# Patient Record
Sex: Female | Born: 1980 | Race: White | Hispanic: No | Marital: Married | Smoking: Never smoker
Health system: Southern US, Community
[De-identification: ages and names within clinical notes are randomized; demographics above are authoritative.]

## PROBLEM LIST (undated history)

## (undated) DIAGNOSIS — F32A Depression, unspecified: Secondary | ICD-10-CM

## (undated) DIAGNOSIS — Q249 Congenital malformation of heart, unspecified: Secondary | ICD-10-CM

## (undated) DIAGNOSIS — N898 Other specified noninflammatory disorders of vagina: Principal | ICD-10-CM

## (undated) DIAGNOSIS — E039 Hypothyroidism, unspecified: Secondary | ICD-10-CM

## (undated) DIAGNOSIS — Z8619 Personal history of other infectious and parasitic diseases: Secondary | ICD-10-CM

## (undated) DIAGNOSIS — B009 Herpesviral infection, unspecified: Secondary | ICD-10-CM

## (undated) DIAGNOSIS — I8390 Asymptomatic varicose veins of unspecified lower extremity: Secondary | ICD-10-CM

## (undated) DIAGNOSIS — Z86718 Personal history of other venous thrombosis and embolism: Secondary | ICD-10-CM

## (undated) DIAGNOSIS — R519 Headache, unspecified: Secondary | ICD-10-CM

## (undated) HISTORY — DX: Hypothyroidism, unspecified: E03.9

## (undated) HISTORY — DX: Headache, unspecified: R51.9

## (undated) HISTORY — DX: Other specified noninflammatory disorders of vagina: N89.8

## (undated) HISTORY — DX: Personal history of other venous thrombosis and embolism: Z86.718

## (undated) HISTORY — DX: Herpesviral infection, unspecified: B00.9

## (undated) HISTORY — PX: ASD REPAIR: SHX258

## (undated) HISTORY — DX: Personal history of other infectious and parasitic diseases: Z86.19

## (undated) HISTORY — PX: OTHER SURGICAL HISTORY: SHX169

## (undated) HISTORY — DX: Asymptomatic varicose veins of unspecified lower extremity: I83.90

## (undated) HISTORY — DX: Depression, unspecified: F32.A

## (undated) HISTORY — PX: CARDIAC SURGERY: SHX584

---

## 2014-05-13 ENCOUNTER — Encounter (HOSPITAL_COMMUNITY): Payer: Self-pay | Admitting: Emergency Medicine

## 2014-05-13 ENCOUNTER — Emergency Department (HOSPITAL_COMMUNITY): Payer: BC Managed Care – PPO

## 2014-05-13 ENCOUNTER — Emergency Department (HOSPITAL_COMMUNITY)
Admission: EM | Admit: 2014-05-13 | Discharge: 2014-05-13 | Disposition: A | Discharge status: Home / Self Care | Payer: BC Managed Care – PPO | Attending: Emergency Medicine | Admitting: Emergency Medicine

## 2014-05-13 DIAGNOSIS — J40 Bronchitis, not specified as acute or chronic: Secondary | ICD-10-CM | POA: Diagnosis not present

## 2014-05-13 DIAGNOSIS — Y939 Activity, unspecified: Secondary | ICD-10-CM | POA: Diagnosis not present

## 2014-05-13 DIAGNOSIS — Z3202 Encounter for pregnancy test, result negative: Secondary | ICD-10-CM | POA: Insufficient documentation

## 2014-05-13 DIAGNOSIS — Z79899 Other long term (current) drug therapy: Secondary | ICD-10-CM | POA: Insufficient documentation

## 2014-05-13 DIAGNOSIS — R109 Unspecified abdominal pain: Secondary | ICD-10-CM | POA: Diagnosis present

## 2014-05-13 DIAGNOSIS — S39011A Strain of muscle, fascia and tendon of abdomen, initial encounter: Secondary | ICD-10-CM

## 2014-05-13 DIAGNOSIS — X58XXXA Exposure to other specified factors, initial encounter: Secondary | ICD-10-CM | POA: Diagnosis not present

## 2014-05-13 DIAGNOSIS — IMO0002 Reserved for concepts with insufficient information to code with codable children: Secondary | ICD-10-CM | POA: Diagnosis not present

## 2014-05-13 DIAGNOSIS — Y929 Unspecified place or not applicable: Secondary | ICD-10-CM | POA: Diagnosis not present

## 2014-05-13 LAB — URINALYSIS, ROUTINE W REFLEX MICROSCOPIC
Bilirubin Urine: NEGATIVE
GLUCOSE, UA: NEGATIVE mg/dL
Hgb urine dipstick: NEGATIVE
Ketones, ur: NEGATIVE mg/dL
Leukocytes, UA: NEGATIVE
Nitrite: NEGATIVE
Protein, ur: NEGATIVE mg/dL
Specific Gravity, Urine: 1.015 (ref 1.005–1.030)
UROBILINOGEN UA: 1 mg/dL (ref 0.0–1.0)
pH: 6.5 (ref 5.0–8.0)

## 2014-05-13 LAB — BASIC METABOLIC PANEL
Anion gap: 11 (ref 5–15)
BUN: 6 mg/dL (ref 6–23)
CHLORIDE: 103 meq/L (ref 96–112)
CO2: 28 mEq/L (ref 19–32)
Calcium: 8.6 mg/dL (ref 8.4–10.5)
Creatinine, Ser: 0.59 mg/dL (ref 0.50–1.10)
GFR calc non Af Amer: >90 mL/min (ref >90)
GLUCOSE: 114 mg/dL — AB (ref 70–99)
Potassium: 3.5 mEq/L — ABNORMAL LOW (ref 3.7–5.3)
Sodium: 142 mEq/L (ref 137–147)

## 2014-05-13 LAB — CBC WITH DIFFERENTIAL/PLATELET
Basophils Absolute: 0 10*3/uL (ref 0.0–0.1)
Basophils Relative: 0 % (ref 0–1)
Eosinophils Absolute: 0 10*3/uL (ref 0.0–0.7)
Eosinophils Relative: 0 % (ref 0–5)
HEMATOCRIT: 38.3 % (ref 36.0–46.0)
HEMOGLOBIN: 13.1 g/dL (ref 12.0–15.0)
LYMPHS ABS: 0.6 10*3/uL — AB (ref 0.7–4.0)
Lymphocytes Relative: 12 % (ref 12–46)
MCH: 31.4 pg (ref 26.0–34.0)
MCHC: 34.2 g/dL (ref 30.0–36.0)
MCV: 91.8 fL (ref 78.0–100.0)
MONOS PCT: 10 % (ref 3–12)
Monocytes Absolute: 0.6 10*3/uL (ref 0.1–1.0)
NEUTROS ABS: 4.2 10*3/uL (ref 1.7–7.7)
NEUTROS PCT: 78 % — AB (ref 43–77)
Platelets: 184 10*3/uL (ref 150–400)
RBC: 4.17 MIL/uL (ref 3.87–5.11)
RDW: 13 % (ref 11.5–15.5)
WBC: 5.4 10*3/uL (ref 4.0–10.5)

## 2014-05-13 LAB — PREGNANCY, URINE: Preg Test, Ur: NEGATIVE

## 2014-05-13 MED ORDER — ALBUTEROL SULFATE (2.5 MG/3ML) 0.083% IN NEBU
5.0000 mg | INHALATION_SOLUTION | Freq: Once | RESPIRATORY_TRACT | Status: AC
  Administered 2014-05-13: 5 mg via RESPIRATORY_TRACT
  Filled 2014-05-13: qty 6

## 2014-05-13 MED ORDER — IOHEXOL 300 MG/ML  SOLN
100.0000 mL | Freq: Once | INTRAMUSCULAR | Status: AC | PRN
  Administered 2014-05-13: 100 mL via INTRAVENOUS

## 2014-05-13 MED ORDER — METHYLPREDNISOLONE SODIUM SUCC 125 MG IJ SOLR
125.0000 mg | Freq: Once | INTRAMUSCULAR | Status: AC
  Administered 2014-05-13: 125 mg via INTRAVENOUS
  Filled 2014-05-13: qty 2

## 2014-05-13 MED ORDER — BENZONATATE 100 MG PO CAPS
200.0000 mg | ORAL_CAPSULE | Freq: Once | ORAL | Status: AC
  Administered 2014-05-13: 200 mg via ORAL
  Filled 2014-05-13: qty 2

## 2014-05-13 MED ORDER — IOHEXOL 300 MG/ML  SOLN
50.0000 mL | Freq: Once | INTRAMUSCULAR | Status: AC | PRN
  Administered 2014-05-13: 50 mL via ORAL

## 2014-05-13 MED ORDER — ALBUTEROL SULFATE HFA 108 (90 BASE) MCG/ACT IN AERS
2.0000 | INHALATION_SPRAY | RESPIRATORY_TRACT | Status: DC | PRN
  Administered 2014-05-13: 2 via RESPIRATORY_TRACT
  Filled 2014-05-13: qty 6.7

## 2014-05-13 MED ORDER — SODIUM CHLORIDE 0.9 % IV SOLN
Freq: Once | INTRAVENOUS | Status: AC
Start: 2014-05-13 — End: 2014-05-13
  Administered 2014-05-13: 18:00:00 via INTRAVENOUS

## 2014-05-13 MED ORDER — BENZONATATE 100 MG PO CAPS: 100.0000 mg | ORAL_CAPSULE | Freq: Three times a day (TID) | ORAL | Status: DC

## 2014-05-13 MED ORDER — PREDNISONE 20 MG PO TABS: 40.0000 mg | ORAL_TABLET | Freq: Every day | ORAL | Status: DC

## 2014-05-13 NOTE — ED Notes (Signed)
Pt advised we need urine sample

## 2014-05-13 NOTE — Discharge Instructions (Signed)
Muscle Strain A muscle strain is an injury that occurs when a muscle is stretched beyond its normal length. Usually a small number of muscle fibers are torn when this happens. Muscle strain is rated in degrees. First-degree strains have the least amount of muscle fiber tearing and pain. Second-degree and third-degree strains have increasingly more tearing and pain.  Usually, recovery from muscle strain takes 1-2 weeks. Complete healing takes 5-6 weeks.  CAUSES  Muscle strain happens when a sudden, violent force placed on a muscle stretches it too far. This may occur with lifting, sports, or a fall.  RISK FACTORS Muscle strain is especially common in athletes.  SIGNS AND SYMPTOMS At the site of the muscle strain, there may be:  Pain.  Bruising.  Swelling.  Difficulty using the muscle due to pain or lack of normal function. DIAGNOSIS  Your health care provider will perform a physical exam and ask about your medical history. TREATMENT  Often, the best treatment for a muscle strain is resting, icing, and applying cold compresses to the injured area.  HOME CARE INSTRUCTIONS   Use the PRICE method of treatment to promote muscle healing during the first 2-3 days after your injury. The PRICE method involves:  Protecting the muscle from being injured again.  Restricting your activity and resting the injured body part.  Icing your injury. To do this, put ice in a plastic bag. Place a towel between your skin and the bag. Then, apply the ice and leave it on from 15-20 minutes each hour. After the third day, switch to moist heat packs.  Apply compression to the injured area with a splint or elastic bandage. Be careful not to wrap it too tightly. This may interfere with blood circulation or increase swelling.  Elevate the injured body part above the level of your heart as often as you can.  Only take over-the-counter or prescription medicines for pain, discomfort, or fever as directed by your  health care provider.  Warming up prior to exercise helps to prevent future muscle strains. SEEK MEDICAL CARE IF:   You have increasing pain or swelling in the injured area.  You have numbness, tingling, or a significant loss of strength in the injured area. MAKE SURE YOU:   Understand these instructions.  Will watch your condition.  Will get help right away if you are not doing well or get worse. Document Released: 08/19/2005 Document Revised: 06/09/2013 Document Reviewed: 03/18/2013 [facility] Patient Information 2015 [place], [place]. This information is not intended to replace advice given to you by your health care provider. Make sure you discuss any questions you have with your health care provider. Acute Bronchitis Bronchitis is inflammation of the airways that extend from the windpipe into the lungs (bronchi). The inflammation often causes mucus to develop. This leads to a cough, which is the most common symptom of bronchitis.  In acute bronchitis, the condition usually develops suddenly and goes away over time, usually in a couple weeks. Smoking, allergies, and asthma can make bronchitis worse. Repeated episodes of bronchitis may cause further lung problems.  CAUSES Acute bronchitis is most often caused by the same virus that causes a cold. The virus can spread from person to person (contagious) through coughing, sneezing, and touching contaminated objects. SIGNS AND SYMPTOMS   Cough.   Fever.   Coughing up mucus.   Body aches.   Chest congestion.   Chills.   Shortness of breath.   Sore throat.  DIAGNOSIS  Acute bronchitis is usually  diagnosed through a physical exam. Your health care provider will also ask you questions about your medical history. Tests, such as chest X-rays, are sometimes done to rule out other conditions.  TREATMENT  Acute bronchitis usually goes away in a couple weeks. Oftentimes, no medical treatment is necessary. Medicines are sometimes  given for relief of fever or cough. Antibiotic medicines are usually not needed but may be prescribed in certain situations. In some cases, an inhaler may be recommended to help reduce shortness of breath and control the cough. A cool mist vaporizer may also be used to help thin bronchial secretions and make it easier to clear the chest.  HOME CARE INSTRUCTIONS  Get plenty of rest.   Drink enough fluids to keep your urine clear or pale yellow (unless you have a medical condition that requires fluid restriction). Increasing fluids may help thin your respiratory secretions (sputum) and reduce chest congestion, and it will prevent dehydration.   Take medicines only as directed by your health care provider.  If you were prescribed an antibiotic medicine, finish it all even if you start to feel better.  Avoid smoking and secondhand smoke. Exposure to cigarette smoke or irritating chemicals will make bronchitis worse. If you are a smoker, consider using nicotine gum or skin patches to help control withdrawal symptoms. Quitting smoking will help your lungs heal faster.   Reduce the chances of another bout of acute bronchitis by washing your hands frequently, avoiding people with cold symptoms, and trying not to touch your hands to your mouth, nose, or eyes.   Keep all follow-up visits as directed by your health care provider.  SEEK MEDICAL CARE IF: Your symptoms do not improve after 1 week of treatment.  SEEK IMMEDIATE MEDICAL CARE IF:  You develop an increased fever or chills.   You have chest pain.   You have severe shortness of breath.  You have bloody sputum.   You develop dehydration.  You faint or repeatedly feel like you are going to pass out.  You develop repeated vomiting.  You develop a severe headache. MAKE SURE YOU:   Understand these instructions.  Will watch your condition.  Will get help right away if you are not doing well or get worse. Document Released:  09/26/2004 Document Revised: 01/03/2014 Document Reviewed: 02/09/2013 [facility] Patient Information 2015 [place], [place]. This information is not intended to replace advice given to you by your health care provider. Make sure you discuss any questions you have with your health care provider.

## 2014-05-13 NOTE — ED Provider Notes (Signed)
CSN: [id number]     Arrival date & time 05/13/14  1716 History   First MD Initiated Contact with Patient 05/13/14 1735     Chief Complaint  Patient presents with  . Abdominal Pain     (Consider location/radiation/quality/duration/timing/severity/associated sxs/prior Treatment) HPI Comments: Patient presents to ER for evaluation of cough and abdominal pain. Patient reports that she developed cough earlier in the week. She has had persistent and worsening cough for 5 days. Patient was seen earlier in the week and diagnosed with bilateral ear infections, started on amoxicillin. Cough worsened through the course of the week, was seen again today and antibiotic was switched to Bactrim. She was also prescribed a hydrocortisone-based cough syrup which she has not started yet. She reports that she has had persistent cough for the course of today. She is lying on her bed, had an episode of coughing and felt sudden pain in her lower abdomen. Patient has had sharp pain in the area ever since. No nausea, vomiting, diarrhea.  Patient is a 33 y.o. female presenting with abdominal pain.  Abdominal Pain Associated symptoms: cough     History reviewed. No pertinent past medical history. History reviewed. No pertinent past surgical history. History reviewed. No pertinent family history. History  Substance Use Topics  . Smoking status: Never Smoker   . Smokeless tobacco: Not on file  . Alcohol Use: No   OB History   Grav Para Term Preterm Abortions TAB SAB Ect Mult Living                 Review of Systems  Respiratory: Positive for cough.   Gastrointestinal: Positive for abdominal pain.  All other systems reviewed and are negative.     Allergies  Review of patient's allergies indicates no known allergies.  Home Medications   Prior to Admission medications   Medication Sig Start Date End Date Taking? Authorizing Provider  valACYclovir (VALTREX) 1000 MG tablet Take 1,000 mg by mouth daily.   02/04/14  Yes Historical Provider, MD  HYDROcodone-homatropine (HYCODAN) 5-1.5 MG/5ML syrup Take 5 mLs by mouth every 4 (four) hours as needed for cough.  05/13/14   Historical Provider, MD  sulfamethoxazole-trimethoprim (BACTRIM DS) 800-160 MG per tablet Take 1 tablet by mouth 2 (two) times daily.  05/13/14   Historical Provider, MD   BP 128/72  Pulse 75  Temp(Src) 100.3 F (37.9 C) (Oral)  Resp 19  Ht  (1.626 m)  Wt 160 lb (72.576 kg)  BMI 27.45 kg/m2  SpO2 100%  LMP 05/04/2014 Physical Exam  Constitutional: She is oriented to person, place, and time. She appears well-developed and well-nourished. No distress.  HENT:  Head: Normocephalic and atraumatic.  Right Ear: Hearing normal.  Left Ear: Hearing normal.  Nose: Nose normal.  Mouth/Throat: Oropharynx is clear and moist and mucous membranes are normal.  Eyes: Conjunctivae and EOM are normal. Pupils are equal, round, and reactive to light.  Neck: Normal range of motion. Neck supple.  Cardiovascular: Regular rhythm, S1 normal and S2 normal.  Exam reveals no gallop and no friction rub.   No murmur heard. Pulmonary/Chest: Effort normal and breath sounds normal. No respiratory distress. She exhibits no tenderness.  Abdominal: Soft. Normal appearance and bowel sounds are normal. There is no hepatosplenomegaly. There is generalized tenderness. There is no rebound, no guarding, no tenderness at McBurney's point and negative Murphy's sign. No hernia.  Musculoskeletal: Normal range of motion.  Neurological: She is alert and oriented to person, place, and  time. She has normal strength. No cranial nerve deficit or sensory deficit. Coordination normal. GCS eye subscore is 4. GCS verbal subscore is 5. GCS motor subscore is 6.  Skin: Skin is warm, dry and intact. No rash noted. No cyanosis.  Psychiatric: She has a normal mood and affect. Her speech is normal and behavior is normal. Thought content normal.    ED Course  Procedures (including  critical care time) Labs Review Labs Reviewed  CBC WITH DIFFERENTIAL - Abnormal; Notable for the following:    Neutrophils Relative % 78 (*)    Lymphs Abs 0.6 (*)    All other components within normal limits  BASIC METABOLIC PANEL - Abnormal; Notable for the following:    Potassium 3.5 (*)    Glucose, Bld 114 (*)    All other components within normal limits  URINALYSIS, ROUTINE W REFLEX MICROSCOPIC  PREGNANCY, URINE    Imaging Review Dg Chest 2 View  05/13/2014   CLINICAL DATA:  Cough.  Fever.  EXAM: CHEST  2 VIEW  COMPARISON:  No priors.  FINDINGS: Lung volumes are normal. No consolidative airspace disease. No pleural effusions. No pneumothorax. No pulmonary nodule or mass noted. Pulmonary vasculature and the cardiomediastinal silhouette are within normal limits. Status post median sternotomy.  IMPRESSION: No radiographic evidence of acute cardiopulmonary disease.   Electronically Signed   By: Trudie Reed M.D.   On: 05/13/2014 19:26   Ct Abdomen Pelvis W Contrast  05/13/2014   CLINICAL DATA:  Lower abdominal pain.  EXAM: CT ABDOMEN AND PELVIS WITH CONTRAST  TECHNIQUE: Multidetector CT imaging of the abdomen and pelvis was performed using the standard protocol following bolus administration of intravenous contrast.  CONTRAST:  50mL OMNIPAQUE IOHEXOL 300 MG/ML SOLN, OMNIPAQUE IOHEXOL 300 MG/ML SOLN  COMPARISON:  None.  FINDINGS: The lung bases demonstrate streaky atelectasis but no infiltrates or effusions. The heart is normal in size. No pericardial effusion.  The liver is unremarkable. No focal lesions or biliary dilatation. The portal and hepatic veins are patent. The gallbladder is mildly contracted. No common bile duct dilatation. The pancreas is normal. The spleen is upper limits of normal in size. No focal lesions. The adrenal glands and kidneys are normal.  The stomach, duodenum, small bowel and colon are unremarkable. No inflammatory changes, mass lesions or obstructive  findings. The appendix is normal. No mesenteric or retroperitoneal mass or adenopathy. The aorta and branch vessels are patent. The major venous structures are patent. 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  The uterus and ovaries are unremarkable. Normal endometrial thickness. There is a small amount of fluid in the endocervical canal. The left ovary demonstrates a simple appearing cyst. The bladder is normal. No pelvic mass or adenopathy. No free pelvic fluid collections. No inguinal mass or adenopathy.  The bony structures are unremarkable.  IMPRESSION: No acute abdominal/pelvic findings, mass lesions or adenopathy.   Electronically Signed   By: Loralie Champagne M.D.   On: 05/13/2014 21:23     EKG Interpretation None      MDM   Final diagnoses:  None   bronchitis  Abdominal wall strain  Patient presents to the ER for evaluation of cough and abdominal pain. Patient has had a cough for nearly a week. She has been on 2 different antibiotics. Cough worsened today. She had sudden onset of pain across her mid and lower abdomen with a cough earlier. The pain is pretty constant since. It hurts when she moves. She has diffuse  tenderness across the lower abdomen, but there is no guarding or rebound.  Chest x-ray does not show pneumonia. Lab work was unremarkable. She did have a low-grade fever. This is likely secondary to upper respiratory infection. With fever and abdominal pain, a CT scan was considered important to rule out appendicitis, diverticulitis, et Karie Soda. CT scan was performed and was negative. Patient will be discharged. She can continue the current antibiotics, will add prednisone and albuterol. She was administered Solu-Medrol and a nebulizer here in the ER with improvement of her breathing and decreased cough. She has already been prescribed Hycodan, she can use this as needed at home. She was counseled that she should not breast-feed after taking the hydrocodone.    Gilda Crease, MD 05/13/14 2135

## 2014-05-13 NOTE — ED Notes (Signed)
RT in room for MDI instruction for discharge. Prescriptions and follow up care discussed. No concerns voiced.

## 2014-05-13 NOTE — ED Notes (Signed)
Pt states she has been coughing past few days and today while coughing she felt sudden pain across her stomach. The pt states the pain is generalized across the middle of abdomen.

## 2014-05-15 ENCOUNTER — Encounter (HOSPITAL_COMMUNITY): Payer: Self-pay | Admitting: Emergency Medicine

## 2014-05-15 ENCOUNTER — Emergency Department (HOSPITAL_COMMUNITY)
Admission: EM | Admit: 2014-05-15 | Discharge: 2014-05-15 | Disposition: A | Discharge status: Home / Self Care | Payer: BC Managed Care – PPO | Source: Home / Self Care | Attending: Emergency Medicine | Admitting: Emergency Medicine

## 2014-05-15 ENCOUNTER — Telehealth (HOSPITAL_COMMUNITY): Payer: Self-pay

## 2014-05-15 DIAGNOSIS — J019 Acute sinusitis, unspecified: Secondary | ICD-10-CM

## 2014-05-15 HISTORY — DX: Congenital malformation of heart, unspecified: Q24.9

## 2014-05-15 LAB — POCT RAPID STREP A: STREPTOCOCCUS, GROUP A SCREEN (DIRECT): NEGATIVE

## 2014-05-15 MED ORDER — IPRATROPIUM BROMIDE 0.02 % IN SOLN
RESPIRATORY_TRACT | Status: AC
  Filled 2014-05-15: qty 2.5

## 2014-05-15 MED ORDER — ALBUTEROL SULFATE (2.5 MG/3ML) 0.083% IN NEBU
INHALATION_SOLUTION | RESPIRATORY_TRACT | Status: AC
  Filled 2014-05-15: qty 6

## 2014-05-15 MED ORDER — AMOXICILLIN-POT CLAVULANATE ER 1000-62.5 MG PO TB12: 2.0000 | ORAL_TABLET | Freq: Two times a day (BID) | ORAL | Status: DC

## 2014-05-15 MED ORDER — IPRATROPIUM BROMIDE 0.02 % IN SOLN
0.5000 mg | Freq: Once | RESPIRATORY_TRACT | Status: AC
  Administered 2014-05-15: 0.5 mg via RESPIRATORY_TRACT

## 2014-05-15 MED ORDER — CEFTRIAXONE SODIUM 1 G IJ SOLR
1.0000 g | Freq: Once | INTRAMUSCULAR | Status: AC
  Administered 2014-05-15: 1 g via INTRAMUSCULAR

## 2014-05-15 MED ORDER — LIDOCAINE HCL (PF) 1 % IJ SOLN
INTRAMUSCULAR | Status: AC
  Filled 2014-05-15: qty 5

## 2014-05-15 MED ORDER — SODIUM CHLORIDE 0.9 % IN NEBU
INHALATION_SOLUTION | RESPIRATORY_TRACT | Status: AC
  Filled 2014-05-15: qty 3

## 2014-05-15 MED ORDER — CEFTRIAXONE SODIUM 1 G IJ SOLR
INTRAMUSCULAR | Status: AC
  Filled 2014-05-15: qty 10

## 2014-05-15 MED ORDER — ALBUTEROL SULFATE (2.5 MG/3ML) 0.083% IN NEBU
5.0000 mg | INHALATION_SOLUTION | Freq: Once | RESPIRATORY_TRACT | Status: AC
  Administered 2014-05-15: 5 mg via RESPIRATORY_TRACT

## 2014-05-15 MED ORDER — SODIUM CHLORIDE 0.9 % IJ SOLN
INTRAMUSCULAR | Status: AC
  Filled 2014-05-15: qty 3

## 2014-05-15 NOTE — ED Notes (Signed)
S/S allergic reaction reviewed w/ pt. 

## 2014-05-15 NOTE — ED Provider Notes (Signed)
CSN: [id number]     Arrival date & time 05/15/14  0902 History   First MD Initiated Contact with Patient 05/15/14 0919     Chief Complaint  Patient presents with  . Fever  . Sore Throat   (Consider location/radiation/quality/duration/timing/severity/associated sxs/prior Treatment) HPI 33 year old female presents complaining of being sick for almost 2 weeks. This started approximately 11 days ago with runny nose and sore throat. She also had bilateral ear pain. After 6 days she went to an urgent care and was diagnosed with bilateral acute otitis media. and was treated with amoxicillin. She got no relief from the amoxicillin. Friday morning 2 days ago she went to a different urgent care, she did not receive any diagnosis but she was treated with Bactrim and prednisone, and was told to stop the amoxicillin. Friday night she went to [facility] emergency department and was diagnosed with bronchitis, told to continue her medications as prescribed and she was also given an albuterol inhaler. She feels like nothing has helped. She has severe sore throat, frontal headache, rhinorrhea, intermittent fevers up to 102F, cough, wheezing. She feels like her symptoms have all been progressively worsening. She did not start the prednisone because she does not like the way it makes her feel. Denies chest pain or shortness of breath. She is currently breast-feeding  Past Medical History  Diagnosis Date  . Congenital heart defect    Past Surgical History  Procedure Laterality Date  . Cardiac surgery      as infant and child   No family history on file. History  Substance Use Topics  . Smoking status: Never Smoker   . Smokeless tobacco: Not on file  . Alcohol Use: No   OB History   Grav Para Term Preterm Abortions TAB SAB Ect Mult Living                 Review of Systems  Constitutional: Positive for fever, chills and fatigue.  HENT: Positive for congestion, ear pain, postnasal drip, rhinorrhea, sinus  pressure and sore throat. Negative for ear discharge.   Respiratory: Positive for cough, chest tightness and wheezing. Negative for shortness of breath.   Cardiovascular: Negative for chest pain and leg swelling.  Gastrointestinal: Positive for abdominal pain (had abdominal pain on Friday, that has resolved). Negative for nausea, vomiting and diarrhea.  Musculoskeletal: Positive for myalgias. Negative for arthralgias.  All other systems reviewed and are negative.   Allergies  Review of patient's allergies indicates no known allergies.  Home Medications   Prior to Admission medications   Medication Sig Start Date End Date Taking? Authorizing Provider  sulfamethoxazole-trimethoprim (BACTRIM DS) 800-160 MG per tablet Take 1 tablet by mouth 2 (two) times daily.  05/13/14  Yes Historical Provider, MD  amoxicillin-clavulanate (AUGMENTIN XR) 1000-62.5 MG per tablet Take 2 tablets by mouth 2 (two) times daily. 05/15/14   Adrian Blackwater Darilyn Storbeck, PA-C  benzonatate (TESSALON) 100 MG capsule Take 1 capsule (100 mg total) by mouth every 8 (eight) hours. 05/13/14   Gilda Crease, MD  HYDROcodone-homatropine [facility]) 5-1.5 MG/5ML syrup Take 5 mLs by mouth every 4 (four) hours as needed for cough.  05/13/14   Historical Provider, MD  predniSONE (DELTASONE) 20 MG tablet Take 2 tablets (40 mg total) by mouth daily with breakfast. 05/13/14   Gilda Crease, MD  valACYclovir (VALTREX) 1000 MG tablet Take 1,000 mg by mouth daily.  02/04/14   Historical Provider, MD   BP 111/66  Pulse 104  Temp(Src)  100.9 F (38.3 C) (Oral)  Resp 20  SpO2 99%  LMP 05/04/2014 Physical Exam  Nursing note and vitals reviewed. Constitutional: She is oriented to person, place, and time. Vital signs are normal. She appears well-developed and well-nourished. She appears ill. No distress.  HENT:  Head: Normocephalic and atraumatic.  Right Ear: External ear and ear canal normal. Tympanic membrane is injected. Tympanic  membrane is not erythematous, not retracted and not bulging.  Left Ear: External ear and ear canal normal. Tympanic membrane is injected. Tympanic membrane is not erythematous, not retracted and not bulging.  Nose: Right sinus exhibits maxillary sinus tenderness and frontal sinus tenderness. Left sinus exhibits maxillary sinus tenderness and frontal sinus tenderness.  Mouth/Throat: Uvula is midline and mucous membranes are normal. Posterior oropharyngeal erythema (With purulent drainage across the tonsils and posterior oropharynx) present. No oropharyngeal exudate or tonsillar abscesses.  Neck: Normal range of motion. Neck supple.  Cardiovascular: Normal rate, regular rhythm and normal heart sounds.   Pulmonary/Chest: Effort normal. Not tachypneic. No respiratory distress. She has no decreased breath sounds. She has wheezes (diffuse, inspiratory and expiratory). She has no rhonchi. She has no rales.  Lymphadenopathy:       Head (right side): Submandibular and tonsillar adenopathy present.       Head (left side): Submandibular and tonsillar adenopathy present.    She has no cervical adenopathy.       Right: No supraclavicular adenopathy present.       Left: No supraclavicular adenopathy present.  Neurological: She is alert and oriented to person, place, and time. She has normal strength. Coordination normal.  Skin: Skin is warm and dry. No rash noted. She is not diaphoretic.  Psychiatric: She has a normal mood and affect. Judgment normal.    ED Course  Procedures (including critical care time) Labs Review Labs Reviewed  POCT RAPID STREP [facility] URG CARE ONLY)    Imaging Review Dg Chest 2 View  05/13/2014   CLINICAL DATA:  Cough.  Fever.  EXAM: CHEST  2 VIEW  COMPARISON:  No priors.  FINDINGS: Lung volumes are normal. No consolidative airspace disease. No pleural effusions. No pneumothorax. No pulmonary nodule or mass noted. Pulmonary vasculature and the cardiomediastinal silhouette are within  normal limits. Status post median sternotomy.  IMPRESSION: No radiographic evidence of acute cardiopulmonary disease.   Electronically Signed   By: Trudie Reed M.D.   On: 05/13/2014 19:26   Ct Abdomen Pelvis W Contrast  05/13/2014   CLINICAL DATA:  Lower abdominal pain.  EXAM: CT ABDOMEN AND PELVIS WITH CONTRAST  TECHNIQUE: Multidetector CT imaging of the abdomen and pelvis was performed using the standard protocol following bolus administration of intravenous contrast.  CONTRAST:  50mL OMNIPAQUE IOHEXOL 300 MG/ML SOLN, OMNIPAQUE IOHEXOL 300 MG/ML SOLN  COMPARISON:  None.  FINDINGS: The lung bases demonstrate streaky atelectasis but no infiltrates or effusions. The heart is normal in size. No pericardial effusion.  The liver is unremarkable. No focal lesions or biliary dilatation. The portal and hepatic veins are patent. The gallbladder is mildly contracted. No common bile duct dilatation. The pancreas is normal. The spleen is upper limits of normal in size. No focal lesions. The adrenal glands and kidneys are normal.  The stomach, duodenum, small bowel and colon are unremarkable. No inflammatory changes, mass lesions or obstructive findings. The appendix is normal. No mesenteric or retroperitoneal mass or adenopathy. The aorta and branch vessels are patent. The major venous structures are patent. There is  a retroaortic left renal vein.  The uterus and ovaries are unremarkable. Normal endometrial thickness. There is a small amount of fluid in the endocervical canal. The left ovary demonstrates a simple appearing cyst. The bladder is normal. No pelvic mass or adenopathy. No free pelvic fluid collections. No inguinal mass or adenopathy.  The bony structures are unremarkable.  IMPRESSION: No acute abdominal/pelvic findings, mass lesions or adenopathy.   Electronically Signed   By: Loralie Champagne M.D.   On: 05/13/2014 21:23     MDM   1. Acute sinusitis, unspecified    The most likely diagnosis at  this point he is bacterial sinusitis, that could explain all of her symptoms. She has treatment failure with amoxicillin and Bactrim, will treat with an injection of ceftriaxone here and discharge with high-dose Augmentin. She will start the prednisone, I will use her inhaler as needed for wheezing. She had improvement in the wheezing with a breathing treatment today. followup if no improvement in 4 days.   Meds ordered this encounter  Medications  . albuterol (PROVENTIL) (2.5 MG/3ML) 0.083% nebulizer solution 5 mg    Sig:   . ipratropium (ATROVENT) nebulizer solution 0.5 mg    Sig:   . cefTRIAXone (ROCEPHIN) injection 1 g    Sig:   . amoxicillin-clavulanate (AUGMENTIN XR) 1000-62.5 MG per tablet    Sig: Take 2 tablets by mouth 2 (two) times daily.    Dispense:  40 tablet    Refill:  0    Order Specific Question:  Supervising Provider    Answer:  Lorenz Coaster, DAVID C [[id number]]       Graylon Good, PA-C 05/15/14 1034

## 2014-05-15 NOTE — Discharge Instructions (Signed)

## 2014-05-15 NOTE — ED Provider Notes (Signed)
Medical screening examination/treatment/procedure(s) were performed by non-physician practitioner and as supervising physician I was immediately available for consultation/collaboration.  Leslee Home, M.D.  Reuben Likes, MD 05/15/14 1100

## 2014-05-15 NOTE — ED Notes (Signed)
Chart reviewed by Almedia Balls PA.  Pt need to have the antibiotic prescribed.  Several pharmacies in [place] and they did not have medication.  The closest pharmacy to patient that had medication is the [facility].  Rx was e-scribed to that pharmacy.  Patient called, made aware and states she will pick RX up today

## 2014-05-15 NOTE — ED Notes (Signed)
Pt instructed to take her first dose of her previous Prednisone Rx.  Pt did so.

## 2014-05-15 NOTE — ED Notes (Signed)
Started with runny nose & sore throat 11 days ago.  Went to an urgent care 6 days ago - was given amoxicillin for BOM.  No improvement with abx.  Went to another urgent care 2 days ago - wasn't told dx, but was given Septra DS.  That night, went to ED - was told to stop taking Septra DS; has been taking albuterol HFA.  Reports severe sore throat and feeling of swollen throat, bilat earache returning, HA.  Started with intermittent fevers 3 days ago.  C/O productive cough with wheezing.

## 2014-05-17 LAB — CULTURE, GROUP A STREP

## 2014-06-27 ENCOUNTER — Ambulatory Visit (INDEPENDENT_AMBULATORY_CARE_PROVIDER_SITE_OTHER): Payer: BC Managed Care – PPO | Admitting: Adult Health

## 2014-06-27 ENCOUNTER — Encounter: Payer: Self-pay | Admitting: Adult Health

## 2014-06-27 VITALS — BP 118/60 | Ht 64.0 in | Wt 173.5 lb

## 2014-06-27 DIAGNOSIS — Z8619 Personal history of other infectious and parasitic diseases: Secondary | ICD-10-CM | POA: Insufficient documentation

## 2014-06-27 DIAGNOSIS — L298 Other pruritus: Secondary | ICD-10-CM

## 2014-06-27 DIAGNOSIS — N898 Other specified noninflammatory disorders of vagina: Secondary | ICD-10-CM

## 2014-06-27 HISTORY — DX: Personal history of other infectious and parasitic diseases: Z86.19

## 2014-06-27 HISTORY — DX: Other specified noninflammatory disorders of vagina: N89.8

## 2014-06-27 LAB — POCT WET PREP (WET MOUNT): WBC, Wet Prep HPF POC: NEGATIVE

## 2014-06-27 MED ORDER — VALACYCLOVIR HCL 1 G PO TABS: 1000.0000 mg | ORAL_TABLET | Freq: Every day | ORAL | Status: DC

## 2014-06-27 NOTE — Progress Notes (Signed)
Subjective:     Patient ID: Joan Mcgee, female   DOB: 06/10/1981, 33 y.o.   MRN: [id number]  HPI Joan Mcgee is a 33 year old white female,new to this practice, in complaining of vaginal itch then got period and it was better.Has had yeast and BV in past.Used diflucan and boric acid supp.  Review of Systems See HPI Reviewed past medical,surgical, social and family history. Reviewed medications and allergies.     Objective:   Physical Exam BP 118/60  Ht 5\' 4"  (1.626 m)  Wt 173 lb 8 oz (78.699 kg)  BMI 29.77 kg/m2  LMP 06/22/2014   Skin warm and dry.Pelvic: external genitalia is normal in appearance, vagina: scant discharge without odor, cervix:smooth and bulbous, uterus: normal size, shape and contour, non tender, no masses felt, adnexa: no masses or tenderness noted. Wet prep:negative,has history of herpes and wants refill on valtrex.  Assessment:     Vaginal itch   History of herpes  Plan:    Refilled valtrex 1 gm #90 1 daily with 4 refills Try rephresh Will call when for pap appt in near future

## 2014-06-27 NOTE — Patient Instructions (Signed)
Try REPHRESH to stabilize PH Follow up for pap in near future

## 2014-07-05 ENCOUNTER — Encounter: Payer: Self-pay | Admitting: Adult Health

## 2014-07-19 ENCOUNTER — Other Ambulatory Visit: Payer: BC Managed Care – PPO | Admitting: Adult Health

## 2014-07-26 ENCOUNTER — Other Ambulatory Visit: Payer: BC Managed Care – PPO | Admitting: Adult Health

## 2014-08-19 ENCOUNTER — Telehealth: Payer: Self-pay | Admitting: *Deleted

## 2014-08-19 ENCOUNTER — Other Ambulatory Visit: Payer: BC Managed Care – PPO | Admitting: Adult Health

## 2014-08-19 ENCOUNTER — Ambulatory Visit (INDEPENDENT_AMBULATORY_CARE_PROVIDER_SITE_OTHER): Payer: BC Managed Care – PPO | Admitting: Adult Health

## 2014-08-19 ENCOUNTER — Encounter: Payer: Self-pay | Admitting: Adult Health

## 2014-08-19 VITALS — BP 128/72 | Temp 101.4°F | Ht 64.0 in | Wt 181.0 lb

## 2014-08-19 DIAGNOSIS — N61 Inflammatory disorders of breast: Secondary | ICD-10-CM

## 2014-08-19 MED ORDER — AMOXICILLIN-POT CLAVULANATE 875-125 MG PO TABS: 1.0000 | ORAL_TABLET | Freq: Two times a day (BID) | ORAL | Status: DC

## 2014-08-19 MED ORDER — IBUPROFEN 800 MG PO TABS: 800.0000 mg | ORAL_TABLET | Freq: Three times a day (TID) | ORAL | Status: DC | PRN

## 2014-08-19 NOTE — Patient Instructions (Signed)
Breastfeeding and Mastitis Mastitis is inflammation of the breast tissue. It can occur in women who are breastfeeding. This can make breastfeeding painful. Mastitis will sometimes go away on its own. Your health care provider will help determine if treatment is needed. CAUSES Mastitis is often associated with a blocked milk (lactiferous) duct. This can happen when too much milk builds up in the breast. Causes of excess milk in the breast can include:  Poor latch-on. If your baby is not latched onto the breast properly, she or he may not empty your breast completely while breastfeeding.  Allowing too much time to pass between feedings.  Wearing a bra or other clothing that is too tight. This puts extra pressure on the lactiferous ducts so milk does not flow through them as it should. Mastitis can also be caused by a bacterial infection. Bacteria may enter the breast tissue through cuts or openings in the skin. In women who are breastfeeding, this may occur because of cracked or irritated skin. Cracks in the skin are often caused when your baby does not latch on properly to the breast. SIGNS AND SYMPTOMS  Swelling, redness, tenderness, and pain in an area of the breast.  Swelling of the glands under the arm on the same side.  Fever may or may not accompany mastitis. If an infection is allowed to progress, a collection of pus (abscess) may develop. DIAGNOSIS  Your health care provider can usually diagnose mastitis based on your symptoms and a physical exam. Tests may be done to help confirm the diagnosis. These may include:  Removal of pus from the breast by applying pressure to the area. This pus can be examined in the lab to determine which bacteria are present. If an abscess has developed, the fluid in the abscess can be removed with a needle. This can also be used to confirm the diagnosis and determine the bacteria present. In most cases, pus will not be present.  Blood tests to determine if  your body is fighting a bacterial infection.  Mammogram or ultrasound tests to rule out other problems or diseases. TREATMENT  Mastitis that occurs with breastfeeding will sometimes go away on its own. Your health care provider may choose to wait 24 hours after first seeing you to decide whether a prescription medicine is needed. If your symptoms are worse after 24 hours, your health care provider will likely prescribe an antibiotic medicine to treat the mastitis. He or she will determine which bacteria are most likely causing the infection and will then select an appropriate antibiotic medicine. This is sometimes changed based on the results of tests performed to identify the bacteria, or if there is no response to the antibiotic medicine selected. Antibiotic medicines are usually given by mouth. You may also be given medicine for pain. HOME CARE INSTRUCTIONS  Only take over-the-counter or prescription medicines for pain, fever, or discomfort as directed by your health care provider.  If your health care provider prescribed an antibiotic medicine, take the medicine as directed. Make sure you finish it even if you start to feel better.  Do not wear a tight or underwire bra. Wear a soft, supportive bra.  Increase your fluid intake, especially if you have a fever.  Continue to empty the breast. Your health care provider can tell you whether this milk is safe for your infant or needs to be thrown out. You may be told to stop nursing until your health care provider thinks it is safe for your baby.   Use a breast pump if you are advised to stop nursing.  Keep your nipples clean and dry.  Empty the first breast completely before going to the other breast. If your baby is not emptying your breasts completely for some reason, use a breast pump to empty your breasts.  If you go back to work, pump your breasts while at work to stay in time with your nursing schedule.  Avoid allowing your breasts to become  overly filled with milk (engorged). SEEK MEDICAL CARE IF:  You have pus-like discharge from the breast.  Your symptoms do not improve with the treatment prescribed by your health care provider within 2 days. SEEK IMMEDIATE MEDICAL CARE IF:  Your pain and swelling are getting worse.  You have pain that is not controlled with medicine.  You have a red line extending from the breast toward your armpit.  You have a fever or persistent symptoms for more than 2-3 days.  You have a fever and your symptoms suddenly get worse. MAKE SURE YOU:   Understand these instructions.  Will watch your condition.  Will get help right away if you are not doing well or get worse. Document Released: 12/14/2004 Document Revised: 08/24/2013 Document Reviewed: 03/25/2013 [facility] Patient Information 2015 [place], [place]. This information is not intended to replace advice given to you by your health care provider. Make sure you discuss any questions you have with your health care provider. Take Augmentin and rest and take motrin or tylenol

## 2014-08-19 NOTE — Telephone Encounter (Signed)
Told her what have to see, so she is coming in now to be seen

## 2014-08-19 NOTE — Telephone Encounter (Signed)
Pt running a fever 101, right breast sore to touch. Pt states she is some better today. Pt requesting an ABX.

## 2014-08-19 NOTE — Progress Notes (Signed)
Subjective:     Patient ID: Joan Mcgee, female   DOB: 11/18/1980, 33 y.o.   MRN: [id number]  HPI Joan Mcgee is a 33 year old white female in complaining of pain in right breast and fever, feels bad.Her daughter is 222.34 years old and still breast feeds at night.Throat scratchy too.  Review of Systems See HPI Reviewed past medical,surgical, social and family history. Reviewed medications and allergies.     Objective:   Physical Exam BP 128/72 mmHg  Temp(Src) 101.4 F (38.6 C)  Ht 5\' 4"  (1.626 m)  Wt 181 lb (82.101 kg)  BMI 31.05 kg/m2  LMP 08/05/2014    Skin warm and dry,  Breasts:no dominate palpable mass, retraction or nipple discharge on left, on right has thickness and pain at 10-12 0'clock, no retraction or nipple discharge, has bilateral swollen lymph nodes in neck and throat red no pustules, and lungs are clear.  Assessment:     Mastitis right breast    Plan:     Rx Augmentin 875 mg 1 bid x 10 days #20 no refills Rx Motrin 800 mg #60 1 every 8 hours prn with 1 refill Use cabbage leaves, or ice prn Return as scheduled 12/29 for physical.   Rest, use warm salt water gargles and tylenol as needed If not better call Monday, eat yogurt but if has itching use monistat

## 2014-08-30 ENCOUNTER — Other Ambulatory Visit (HOSPITAL_COMMUNITY)
Admission: RE | Admit: 2014-08-30 | Discharge: 2014-08-30 | Disposition: A | Discharge status: Home / Self Care | Payer: BLUE CROSS/BLUE SHIELD | Source: Ambulatory Visit | Attending: Adult Health | Admitting: Adult Health

## 2014-08-30 ENCOUNTER — Encounter: Payer: Self-pay | Admitting: Adult Health

## 2014-08-30 ENCOUNTER — Ambulatory Visit (INDEPENDENT_AMBULATORY_CARE_PROVIDER_SITE_OTHER): Payer: BC Managed Care – PPO | Admitting: Adult Health

## 2014-08-30 VITALS — BP 96/62 | HR 76 | Ht 62.75 in | Wt 186.0 lb

## 2014-08-30 DIAGNOSIS — Z01419 Encounter for gynecological examination (general) (routine) without abnormal findings: Secondary | ICD-10-CM | POA: Insufficient documentation

## 2014-08-30 DIAGNOSIS — Z1151 Encounter for screening for human papillomavirus (HPV): Secondary | ICD-10-CM | POA: Diagnosis present

## 2014-08-30 NOTE — Progress Notes (Signed)
Patient ID: Joan Mcgee, female   DOB: 03/25/1981, 33 y.o.   MRN: [id number] History of Present Illness: Joan Sheldonshley is a 33 year old white female, married in for a pap and physical.She was seen 12/18 for mastitis and sore throat and was better the next day after taking Augmentin.   Current Medications, Allergies, Past Medical History, Past Surgical History, Family History and Social History were reviewed in [facility] electronic medical record.     Review of Systems:  Patient denies any headaches, blurred vision, shortness of breath, chest pain, abdominal pain, problems with bowel movements, urination, or intercourse. No joint pain or mood swings, has started exercising want to lose weight.   Physical Exam:BP 96/62 mmHg  Pulse 76  Ht 5' 2.75" (1.594 m)  Wt 186 lb (84.369 kg)  BMI 33.21 kg/m2  LMP 08/12/2014 General:  Well developed, well nourished, no acute distress Skin:  Warm and dry Neck:  Midline trachea, normal thyroid Lungs; Clear to auscultation bilaterally Breast:  No dominant palpable mass, retraction, or nipple discharge Cardiovascular: Regular rate and rhythm Abdomen:  Soft, non tender, no hepatosplenomegaly Pelvic:  External genitalia is normal in appearance.  The vagina is normal in appearance. The cervix is bulbous.  Uterus is felt to be normal size, shape, and contour.  No adnexal masses or tenderness noted. Extremities:  No swelling, has varicose veins bilaterally in legs, has had laser once Psych:  No mood changes.alert and cooperative,seems happy   Impression:  Well woman gyn exam with pap   Plan: Try Whole 30  Physical in 1 year Mammogram at 39  Call [facility] [place] Continue exercise and review handout on weight loss

## 2014-08-30 NOTE — Patient Instructions (Signed)
Physical in 1 year Mammogram at 39  Try Whole 30 Keep exercising Calorie Counting for Weight Loss Calories are energy you get from the things you eat and drink. Your body uses this energy to keep you going throughout the day. The number of calories you eat affects your weight. When you eat more calories than your body needs, your body stores the extra calories as fat. When you eat fewer calories than your body needs, your body burns fat to get the energy it needs. Calorie counting means keeping track of how many calories you eat and drink each day. If you make sure to eat fewer calories than your body needs, you should lose weight. In order for calorie counting to work, you will need to eat the number of calories that are right for you in a day to lose a healthy amount of weight per week. A healthy amount of weight to lose per week is usually 1-2 lb (0.5-0.9 kg). A dietitian can determine how many calories you need in a day and give you suggestions on how to reach your calorie goal.  WHAT IS MY MY PLAN? My goal is to have __________ calories per day.  If I have this many calories per day, I should lose around __________ pounds per week. WHAT DO I NEED TO KNOW ABOUT CALORIE COUNTING? In order to meet your daily calorie goal, you will need to:  Find out how many calories are in each food you would like to eat. Try to do this before you eat.  Decide how much of the food you can eat.  Write down what you ate and how many calories it had. Doing this is called keeping a food log. WHERE DO I FIND CALORIE INFORMATION? The number of calories in a food can be found on a Nutrition Facts label. Note that all the information on a label is based on a specific serving of the food. If a food does not have a Nutrition Facts label, try to look up the calories online or ask your dietitian for help. HOW DO I DECIDE HOW MUCH TO EAT? To decide how much of the food you can eat, you will need to consider both the  number of calories in one serving and the size of one serving. This information can be found on the Nutrition Facts label. If a food does not have a Nutrition Facts label, look up the information online or ask your dietitian for help. Remember that calories are listed per serving. If you choose to have more than one serving of a food, you will have to multiply the calories per serving by the amount of servings you plan to eat. For example, the label on a package of bread might say that a serving size is 1 slice and that there are 90 calories in a serving. If you eat 1 slice, you will have eaten 90 calories. If you eat 2 slices, you will have eaten 180 calories. HOW DO I KEEP A FOOD LOG? After each meal, record the following information in your food log:  What you ate.  How much of it you ate.  How many calories it had.  Then, add up your calories. Keep your food log near you, such as in a small notebook in your pocket. Another option is to use a mobile app or website. Some programs will calculate calories for you and show you how many calories you have left each time you add an item to the  log. WHAT ARE SOME CALORIE COUNTING TIPS?  Use your calories on foods and drinks that will fill you up and not leave you hungry. Some examples of this include foods like nuts and nut butters, vegetables, lean proteins, and high-fiber foods (more than 5 g fiber per serving).  Eat nutritious foods and avoid empty calories. Empty calories are calories you get from foods or beverages that do not have many nutrients, such as candy and soda. It is better to have a nutritious high-calorie food (such as an avocado) than a food with few nutrients (such as a bag of chips).  Know how many calories are in the foods you eat most often. This way, you do not have to look up how many calories they have each time you eat them.  Look out for foods that may seem like low-calorie foods but are really high-calorie foods, such as  baked goods, soda, and fat-free candy.  Pay attention to calories in drinks. Drinks such as sodas, specialty coffee drinks, alcohol, and juices have a lot of calories yet do not fill you up. Choose low-calorie drinks like water and diet drinks.  Focus your calorie counting efforts on higher calorie items. Logging the calories in a garden salad that contains only vegetables is less important than calculating the calories in a milk shake.  Find a way of tracking calories that works for you. Get creative. Most people who are successful find ways to keep track of how much they eat in a day, even if they do not count every calorie. WHAT ARE SOME PORTION CONTROL TIPS?  Know how many calories are in a serving. This will help you know how many servings of a certain food you can have.  Use a measuring cup to measure serving sizes. This is helpful when you start out. With time, you will be able to estimate serving sizes for some foods.  Take some time to put servings of different foods on your favorite plates, bowls, and cups so you know what a serving looks like.  Try not to eat straight from a bag or box. Doing this can lead to overeating. Put the amount you would like to eat in a cup or on a plate to make sure you are eating the right portion.  Use smaller plates, glasses, and bowls to prevent overeating. This is a quick and easy way to practice portion control. If your plate is smaller, less food can fit on it.  Try not to multitask while eating, such as watching TV or using your computer. If it is time to eat, sit down at a table and enjoy your food. Doing this will help you to start recognizing when you are full. It will also make you more aware of what and how much you are eating. HOW CAN I CALORIE COUNT WHEN EATING OUT?  Ask for smaller portion sizes or child-sized portions.  Consider sharing an entree and sides instead of getting your own entree.  If you get your own entree, eat only half.  Ask for a box at the beginning of your meal and put the rest of your entree in it so you are not tempted to eat it.  Look for the calories on the menu. If calories are listed, choose the lower calorie options.  Choose dishes that include vegetables, fruits, whole grains, low-fat dairy products, and lean protein. Focusing on smart food choices from each of the 5 food groups can help you stay on track at  restaurants.  Choose items that are boiled, broiled, grilled, or steamed.  Choose water, milk, unsweetened iced tea, or other drinks without added sugars. If you want an alcoholic beverage, choose a lower calorie option. For example, a regular margarita can have up to 700 calories and a glass of wine has around 150.  Stay away from items that are buttered, battered, fried, or served with cream sauce. Items labeled "crispy" are usually fried, unless stated otherwise.  Ask for dressings, sauces, and syrups on the side. These are usually very high in calories, so do not eat much of them.  Watch out for salads. Many people think salads are a healthy option, but this is often not the case. Many salads come with bacon, fried chicken, lots of cheese, fried chips, and dressing. All of these items have a lot of calories. If you want a salad, choose a garden salad and ask for grilled meats or steak. Ask for the dressing on the side, or ask for olive oil and vinegar or lemon to use as dressing.  Estimate how many servings of a food you are given. For example, a serving of cooked rice is  cup or about the size of half a tennis ball or one cupcake wrapper. Knowing serving sizes will help you be aware of how much food you are eating at restaurants. The list below tells you how big or small some common portion sizes are based on everyday objects.  1 oz--4 stacked dice.  3 oz--1 deck of cards.  1 tsp--1 dice.  1 Tbsp-- a Ping-Pong ball.  2 Tbsp--1 Ping-Pong ball.   cup--1 tennis ball or 1 cupcake  wrapper.  1 cup--1 baseball. Document Released: 08/19/2005 Document Revised: 01/03/2014 Document Reviewed: 06/24/2013 [facility] Patient Information 2015 [place], [place]. This information is not intended to replace advice given to you by your health care provider. Make sure you discuss any questions you have with your health care provider.

## 2014-09-01 LAB — CYTOLOGY - PAP

## 2014-10-29 ENCOUNTER — Emergency Department (HOSPITAL_COMMUNITY): Payer: No Typology Code available for payment source

## 2014-10-29 ENCOUNTER — Encounter (HOSPITAL_COMMUNITY): Payer: Self-pay

## 2014-10-29 ENCOUNTER — Emergency Department (HOSPITAL_COMMUNITY)
Admission: EM | Admit: 2014-10-29 | Discharge: 2014-10-29 | Disposition: A | Discharge status: Home / Self Care | Payer: No Typology Code available for payment source | Attending: Emergency Medicine | Admitting: Emergency Medicine

## 2014-10-29 DIAGNOSIS — Z872 Personal history of diseases of the skin and subcutaneous tissue: Secondary | ICD-10-CM | POA: Diagnosis not present

## 2014-10-29 DIAGNOSIS — Z79899 Other long term (current) drug therapy: Secondary | ICD-10-CM | POA: Diagnosis not present

## 2014-10-29 DIAGNOSIS — Z8619 Personal history of other infectious and parasitic diseases: Secondary | ICD-10-CM | POA: Insufficient documentation

## 2014-10-29 DIAGNOSIS — M545 Low back pain: Secondary | ICD-10-CM | POA: Diagnosis present

## 2014-10-29 DIAGNOSIS — Q249 Congenital malformation of heart, unspecified: Secondary | ICD-10-CM | POA: Insufficient documentation

## 2014-10-29 DIAGNOSIS — Z7952 Long term (current) use of systemic steroids: Secondary | ICD-10-CM | POA: Diagnosis not present

## 2014-10-29 DIAGNOSIS — Z8679 Personal history of other diseases of the circulatory system: Secondary | ICD-10-CM | POA: Insufficient documentation

## 2014-10-29 DIAGNOSIS — M5416 Radiculopathy, lumbar region: Secondary | ICD-10-CM | POA: Diagnosis not present

## 2014-10-29 MED ORDER — PREDNISONE 20 MG PO TABS
ORAL_TABLET | ORAL | Status: DC
Start: 2014-10-29 — End: 2016-11-05

## 2014-10-29 MED ORDER — OXYCODONE-ACETAMINOPHEN 5-325 MG PO TABS: 1.0000 | ORAL_TABLET | ORAL | Status: DC | PRN

## 2014-10-29 MED ORDER — CYCLOBENZAPRINE HCL 10 MG PO TABS: 10.0000 mg | ORAL_TABLET | Freq: Three times a day (TID) | ORAL | Status: DC | PRN

## 2014-10-29 MED ORDER — OXYCODONE-ACETAMINOPHEN 5-325 MG PO TABS
2.0000 | ORAL_TABLET | Freq: Once | ORAL | Status: AC
  Administered 2014-10-29: 2 via ORAL
  Filled 2014-10-29: qty 2

## 2014-10-29 NOTE — ED Notes (Signed)
Pt states she woke up with pain in her low back down her right leg

## 2014-10-29 NOTE — Discharge Instructions (Signed)
Back Pain, Adult °Back pain is very common. The pain often gets better over time. The cause of back pain is usually not dangerous. Most people can learn to manage their back pain on their own.  °HOME CARE  °· Stay active. Start with short walks on flat ground if you can. Try to walk farther each day. °· Do not sit, drive, or stand in one place for more than 30 minutes. Do not stay in bed. °· Do not avoid exercise or work. Activity can help your back heal faster. °· Be careful when you bend or lift an object. Bend at your knees, keep the object close to you, and do not twist. °· Sleep on a firm mattress. Lie on your side, and bend your knees. If you lie on your back, put a pillow under your knees. °· Only take medicines as told by your doctor. °· Put ice on the injured area. °¨ Put ice in a plastic bag. °¨ Place a towel between your skin and the bag. °¨ Leave the ice on for 15-20 minutes, 03-04 times a day for the first 2 to 3 days. After that, you can switch between ice and heat packs. °· Ask your doctor about back exercises or massage. °· Avoid feeling anxious or stressed. Find good ways to deal with stress, such as exercise. °GET HELP RIGHT AWAY IF:  °· Your pain does not go away with rest or medicine. °· Your pain does not go away in 1 week. °· You have new problems. °· You do not feel well. °· The pain spreads into your legs. °· You cannot control when you poop (bowel movement) or pee (urinate). °· Your arms or legs feel weak or lose feeling (numbness). °· You feel sick to your stomach (nauseous) or throw up (vomit). °· You have belly (abdominal) pain. °· You feel like you may pass out (faint). °MAKE SURE YOU:  °· Understand these instructions. °· Will watch your condition. °· Will get help right away if you are not doing well or get worse. °Document Released: 02/05/2008 Document Revised: 11/11/2011 Document Reviewed: 12/21/2013 °Exit[employer]® Patient Information ©2015 [employer]. This information is not intended  to replace advice given to you by your health care provider. Make sure you discuss any questions you have with your health care provider. ° °

## 2014-10-29 NOTE — ED Notes (Signed)
Pt verbalized understanding of no driving and to use caution within 4 hours of taking pain meds due to meds cause drowsiness 

## 2014-10-31 NOTE — ED Provider Notes (Signed)
CSN: [id number]     Arrival date & time 10/29/14  1032 History   First MD Initiated Contact with Patient 10/29/14 1104     Chief Complaint  Patient presents with  . Back Pain     (Consider location/radiation/quality/duration/timing/severity/associated sxs/prior Treatment) HPI  Joan Mcgee Date is a 34 y.o. female who presents to the Emergency Department complaining of low back pain that radiates to her right leg.  Patient reports history of intermittent low back pain, but states she woke up with intense pain to her back that radiates into her right buttock and down to the level of her mid calf.  She describes the pain as sharp and burning.  Pain is worse with movements and improves at rest.  She is unsure of known injury, but does admit to frequent bending and twisting.  She has tried OTC analgesics w/o relief.  She denies fever, chills, dysuria, urine or bowel changes, numbness or weakness of the lower extremities, or abdominal pain.     Past Medical History  Diagnosis Date  . Congenital heart defect   . Herpes   . Vaginal itching 06/27/2014  . History of herpes simplex infection 06/27/2014  . Varicose veins    Past Surgical History  Procedure Laterality Date  . Cardiac surgery      as infant and child  . Veins     Family History  Problem Relation Age of Onset  . Diabetes Mother   . Heart attack Mother     x 2  . Other Father     has a pacemaker  . Endometriosis Sister   . Cancer Paternal Grandmother     colon   History  Substance Use Topics  . Smoking status: Never Smoker   . Smokeless tobacco: Never Used  . Alcohol Use: No   OB History    Gravida Para Term Preterm AB TAB SAB Ectopic Multiple Living   Review of Systems  Constitutional: Negative for fever.  Respiratory: Negative for shortness of breath.   Gastrointestinal: Negative for vomiting, abdominal pain and constipation.  Genitourinary: Negative for dysuria, hematuria, flank pain, decreased  urine volume and difficulty urinating.  Musculoskeletal: Positive for back pain. Negative for joint swelling.  Skin: Negative for rash.  Neurological: Negative for weakness and numbness.  All other systems reviewed and are negative.     Allergies  Review of patient's allergies indicates no known allergies.  Home Medications   Prior to Admission medications   Medication Sig Start Date End Date Taking? Authorizing Provider  ibuprofen (ADVIL,MOTRIN) 800 MG tablet Take 1 tablet (800 mg total) by mouth every 8 (eight) hours as needed. 08/19/14  Yes Adline Potter, NP  cyclobenzaprine (FLEXERIL) 10 MG tablet Take 1 tablet (10 mg total) by mouth 3 (three) times daily as needed. 10/29/14   Cruz Devilla L. Bairon Klemann, PA-C  oxyCODONE-acetaminophen (PERCOCET/ROXICET) 5-325 MG per tablet Take 1 tablet by mouth every 4 (four) hours as needed. 10/29/14   Keyanna Sandefer L. Bita Cartwright, PA-C  predniSONE (DELTASONE) 20 MG tablet Two tabs po qd x 5 days 10/29/14   Marjani Kobel L. Mohd. Derflinger, PA-C  valACYclovir (VALTREX) 1000 MG tablet Take 1 tablet (1,000 mg total) by mouth daily. Patient not taking: Reported on 10/29/2014 06/27/14   Adline Potter, NP   BP 136/87 mmHg  Pulse 74  Temp(Src) 98.2 F (36.8 C) (Oral)  Resp 20  Ht  (1.626 m)  Wt 170 lb (77.111 kg)  BMI 29.17 kg/m2  SpO2 100%  LMP 10/29/2014 Physical Exam  Constitutional: She is oriented to person, place, and time. She appears well-developed and well-nourished. No distress.  HENT:  Head: Normocephalic and atraumatic.  Neck: Normal range of motion. Neck supple.  Cardiovascular: Normal rate, regular rhythm, normal heart sounds and intact distal pulses.   No murmur heard. Pulmonary/Chest: Effort normal and breath sounds normal. No respiratory distress.  Abdominal: Soft. She exhibits no distension. There is no tenderness. There is no rebound and no guarding.  Musculoskeletal: She exhibits tenderness. She exhibits no edema.       Lumbar back: She exhibits  tenderness and pain. She exhibits normal range of motion, no swelling, no deformity, no laceration and normal pulse.  ttp of the right lumbar paraspinal muscles and SI joint.  No spinal tenderness.  DP pulses are brisk and symmetrical.  Distal sensation intact.  Hip Flexors/Extensors are intact.  Pt has 5/5 strength against resistance of bilateral lower extremities.     Neurological: She is alert and oriented to person, place, and time. She has normal strength. No sensory deficit. She exhibits normal muscle tone. Coordination and gait normal.  Reflex Scores:      Patellar reflexes are 2+ on the right side and 2+ on the left side.      Achilles reflexes are 2+ on the right side and 2+ on the left side. Skin: Skin is warm and dry. No rash noted.  Nursing note and vitals reviewed.   ED Course  Procedures (including critical care time) Labs Review Labs Reviewed - No data to display  Imaging Review Dg Lumbar Spine Complete  10/29/2014   CLINICAL DATA:  Low back pain. Right leg numbness. Remote history of fall and injury in 2000.  EXAM: LUMBAR SPINE - COMPLETE 4+ VIEW  COMPARISON:  CT 05/13/2014  FINDINGS: Degenerative disc disease changes at L4-5 and L5-S1 with disc space narrowing, similar to prior CT abdomen. No fracture or subluxation. SI joints are symmetric. Mild sclerosis around the SI joints inferiorly bilaterally, stable since prior CT.  IMPRESSION: No acute bony abnormality. Mild degenerative disc disease in the lower lumbar spine.  Probable mild bilateral sacroiliitis along the inferior aspects of the SI joints.   Electronically Signed   By: Charlett NoseKevin  Dover M.D.   On: 10/29/2014 12:05     EKG Interpretation None      MDM   Final diagnoses:  Lumbar radiculopathy    Pt is uncomfortable appearing, but non-toxic.  Will address pain and order xray  Pain improved, pt resting comfortably.  Vitals stable.  XR results reviewed and discussed.  No concerning sx's for emergent neurological or  infectious process.  Pt is ambulatory, no focal neuro deficits.  I advised pt that we will tx with pain medication, muscle relaxer and prednisone. Discussed possible disc injury and need for close PMD f/u if sx's are not improving.  She verbalized understanding and agrees to plan.  Appears stable for d/c    Damarri Rampy L. Rowe Robertriplett, PA-C 10/31/14 1713  Joya Gaskinsonald W Wickline, MD 10/31/14 [phone]

## 2015-05-25 ENCOUNTER — Other Ambulatory Visit: Payer: Self-pay | Admitting: Adult Health

## 2015-07-01 ENCOUNTER — Other Ambulatory Visit: Payer: Self-pay | Admitting: Adult Health

## 2015-09-07 ENCOUNTER — Other Ambulatory Visit: Payer: Self-pay | Admitting: Adult Health

## 2016-02-02 ENCOUNTER — Other Ambulatory Visit: Payer: Self-pay | Admitting: Adult Health

## 2016-04-17 ENCOUNTER — Other Ambulatory Visit: Payer: Self-pay | Admitting: Adult Health

## 2016-05-15 ENCOUNTER — Other Ambulatory Visit: Payer: Self-pay | Admitting: Adult Health

## 2016-06-04 ENCOUNTER — Other Ambulatory Visit: Payer: Self-pay | Admitting: Adult Health

## 2016-08-29 ENCOUNTER — Other Ambulatory Visit: Payer: Self-pay | Admitting: Adult Health

## 2016-09-12 ENCOUNTER — Other Ambulatory Visit: Payer: Self-pay | Admitting: Adult Health

## 2016-09-17 ENCOUNTER — Telehealth: Payer: Self-pay | Admitting: *Deleted

## 2016-10-18 ENCOUNTER — Telehealth: Payer: Self-pay | Admitting: Adult Health

## 2016-10-18 NOTE — Telephone Encounter (Signed)
Spoke with pt. Pt has an appt to see JAG in March. She has superficial blood clots in legs. Pt states it hurts and is warm to touch. I advised pt needs to be seen somewhere. Pt states she is getting ready to go out of town. I spoke with JAG and she agreed pt needs to be seen somewhere, preferably PCP. Pt to call PCP and see if she can get appt today. Pt voiced understanding. JSY

## 2016-10-18 NOTE — Telephone Encounter (Signed)
Pt called stating that she would like a call back from Jennifer's nurse. Pt did not state the reason why, please contact pt

## 2016-11-01 ENCOUNTER — Other Ambulatory Visit: Payer: Self-pay | Admitting: Adult Health

## 2016-11-05 ENCOUNTER — Encounter: Payer: Self-pay | Admitting: Adult Health

## 2016-11-05 ENCOUNTER — Ambulatory Visit (INDEPENDENT_AMBULATORY_CARE_PROVIDER_SITE_OTHER): Payer: PRIVATE HEALTH INSURANCE | Admitting: Adult Health

## 2016-11-05 ENCOUNTER — Other Ambulatory Visit (HOSPITAL_COMMUNITY)
Admission: RE | Admit: 2016-11-05 | Discharge: 2016-11-05 | Disposition: A | Discharge status: Home / Self Care | Payer: PRIVATE HEALTH INSURANCE | Source: Ambulatory Visit | Attending: Adult Health | Admitting: Adult Health

## 2016-11-05 VITALS — BP 100/60 | HR 81 | Ht 62.5 in | Wt 158.0 lb

## 2016-11-05 DIAGNOSIS — Z1151 Encounter for screening for human papillomavirus (HPV): Secondary | ICD-10-CM | POA: Insufficient documentation

## 2016-11-05 DIAGNOSIS — Z01419 Encounter for gynecological examination (general) (routine) without abnormal findings: Secondary | ICD-10-CM | POA: Diagnosis present

## 2016-11-05 DIAGNOSIS — Z86718 Personal history of other venous thrombosis and embolism: Secondary | ICD-10-CM

## 2016-11-05 HISTORY — DX: Personal history of other venous thrombosis and embolism: Z86.718

## 2016-11-05 MED ORDER — IBUPROFEN 800 MG PO TABS: 800.0000 mg | ORAL_TABLET | Freq: Three times a day (TID) | ORAL | 1 refills | Status: DC | PRN

## 2016-11-05 MED ORDER — VALACYCLOVIR HCL 1 G PO TABS: 1000.0000 mg | ORAL_TABLET | Freq: Every day | ORAL | 3 refills | Status: DC

## 2016-11-05 NOTE — Patient Instructions (Signed)
Physical in 1 year Pap in 3 if normal Mammogram at 39  

## 2016-11-05 NOTE — Progress Notes (Signed)
Patient ID: Biagio Borgshley Billy, female   DOB: 09/03/1980, 36 y.o.   MRN: [id number] History of Present Illness: Morrie Sheldonshley is a 36 year old white female, married in for a well woman gyn exam and pap. She thought she may have had superficial clot recently and called Dr Margo AyeHall and he did not call her back, so she wants new PCP.  Current Medications, Allergies, Past Medical History, Past Surgical History, Family History and Social History were reviewed in [facility] electronic medical record.     Review of Systems: Patient denies any headaches, hearing loss, fatigue, blurred vision, shortness of breath, chest pain, abdominal pain, problems with bowel movements, urination, or intercourse. No joint pain or mood swings.Has varicose like veins in legs and has appt with [facility] 3/15 has had laser surgery in the past and had superificial blood clots when pregnant. Periods last about 6 days with last 3 days just spotting. Husband had vasectomy.    Physical Exam:BP 100/60 (BP Location: Left Arm, Patient Position: Sitting, Cuff Size: Normal)   Pulse 81   Ht 5' 2.5" (1.588 m)   Wt 158 lb (71.7 kg)   LMP 10/20/2016 (Exact Date)   BMI 28.44 kg/m  General:  Well developed, well nourished, no acute distress Skin:  Warm and dry Neck:  Midline trachea, normal thyroid, good ROM, no lymphadenopathy Lungs; Clear to auscultation bilaterally Breast:  No dominant palpable mass, retraction, or nipple discharge Cardiovascular: Regular rate and rhythm Abdomen:  Soft, non tender, no hepatosplenomegaly Pelvic:  External genitalia is normal in appearance, no lesions.  The vagina is normal in appearance. Urethra has no lesions or masses. The cervix is bulbous.Pap with HPV performed.  Uterus is felt to be normal size, shape, and contour.  No adnexal masses or tenderness noted.Bladder is non tender, no masses felt. Extremities/musculoskeletal:  No swelling, + bilateral varicosities noted, with skin color changes to brown in  some spots, no clubbing or cyanosis Psych:  No mood changes, alert and cooperative,seems happy PHQ 2 score 0.  Impression: 1. Encounter for gynecological examination with Papanicolaou smear of cervix   2. History of blood clots       Plan: Check CBC,CMP,TSH and lipids Meds ordered this encounter  Medications  . valACYclovir (VALTREX) 1000 MG tablet    Sig: Take 1 tablet (1,000 mg total) by mouth daily.    Dispense:  90 tablet    Refill:  3    Order Specific Question:   Supervising Provider    Answer:   Despina HiddenEURE, LUTHER H [[id number]]  . ibuprofen (ADVIL,MOTRIN) 800 MG tablet    Sig: Take 1 tablet (800 mg total) by mouth every 8 (eight) hours as needed.    Dispense:  60 tablet    Refill:  1    Order Specific Question:   Supervising Provider    Answer:   Lazaro ArmsEURE, LUTHER H [[id number]]   Physical in 1 year  Pap in 3 if normal Mammogram at 39  Names given for PCPs in area.

## 2016-11-06 LAB — CYTOLOGY - PAP
Diagnosis: NEGATIVE
HPV: NOT DETECTED

## 2016-11-07 ENCOUNTER — Other Ambulatory Visit: Payer: Self-pay | Admitting: Adult Health

## 2016-11-15 ENCOUNTER — Telehealth: Payer: Self-pay | Admitting: Adult Health

## 2016-11-15 NOTE — Telephone Encounter (Signed)
Try Unisom and melatonin for sleep

## 2016-11-16 LAB — COMPREHENSIVE METABOLIC PANEL
A/G RATIO: 1.9 (ref 1.2–2.2)
ALT: 24 IU/L (ref 0–32)
AST: 21 IU/L (ref 0–40)
Albumin: 3.7 g/dL (ref 3.5–5.5)
Alkaline Phosphatase: 40 IU/L (ref 39–117)
BILIRUBIN TOTAL: 0.4 mg/dL (ref 0.0–1.2)
BUN/Creatinine Ratio: 20 (ref 9–23)
BUN: 14 mg/dL (ref 6–20)
CHLORIDE: 107 mmol/L — AB (ref 96–106)
CO2: 26 mmol/L (ref 18–29)
Calcium: 8.9 mg/dL (ref 8.7–10.2)
Creatinine, Ser: 0.69 mg/dL (ref 0.57–1.00)
GFR, EST AFRICAN AMERICAN: 130 mL/min/{1.73_m2} (ref >59)
GFR, EST NON AFRICAN AMERICAN: 112 mL/min/{1.73_m2} (ref >59)
GLOBULIN, TOTAL: 1.9 g/dL (ref 1.5–4.5)
Glucose: 100 mg/dL — ABNORMAL HIGH (ref 65–99)
POTASSIUM: 5.2 mmol/L (ref 3.5–5.2)
SODIUM: 146 mmol/L — AB (ref 134–144)
TOTAL PROTEIN: 5.6 g/dL — AB (ref 6.0–8.5)

## 2016-11-16 LAB — LIPID PANEL
CHOL/HDL RATIO: 3.3 ratio (ref 0.0–4.4)
Cholesterol, Total: 191 mg/dL (ref 100–199)
HDL: 58 mg/dL (ref >39)
LDL CALC: 119 mg/dL — AB (ref 0–99)
TRIGLYCERIDES: 70 mg/dL (ref 0–149)
VLDL Cholesterol Cal: 14 mg/dL (ref 5–40)

## 2016-11-16 LAB — CBC
HEMATOCRIT: 43.4 % (ref 34.0–46.6)
Hemoglobin: 14.3 g/dL (ref 11.1–15.9)
MCH: 31.6 pg (ref 26.6–33.0)
MCHC: 32.9 g/dL (ref 31.5–35.7)
MCV: 96 fL (ref 79–97)
PLATELETS: 248 10*3/uL (ref 150–379)
RBC: 4.53 x10E6/uL (ref 3.77–5.28)
RDW: 13.3 % (ref 12.3–15.4)
WBC: 4 10*3/uL (ref 3.4–10.8)

## 2016-11-16 LAB — TSH: TSH: 5.6 u[IU]/mL — ABNORMAL HIGH (ref 0.450–4.500)

## 2016-11-19 ENCOUNTER — Encounter: Payer: Self-pay | Admitting: Adult Health

## 2016-11-19 ENCOUNTER — Telehealth: Payer: Self-pay | Admitting: Adult Health

## 2016-11-19 DIAGNOSIS — E039 Hypothyroidism, unspecified: Secondary | ICD-10-CM

## 2016-11-19 HISTORY — DX: Hypothyroidism, unspecified: E03.9

## 2016-11-19 MED ORDER — LEVOTHYROXINE SODIUM 50 MCG PO TABS: 50.0000 ug | ORAL_TABLET | Freq: Every day | ORAL | 2 refills | Status: DC

## 2016-11-19 NOTE — Telephone Encounter (Signed)
Pt aware of labs, will rx synthroid 50 mcg, and recheck labs in 8 weeks, decrease carbs

## 2016-11-21 ENCOUNTER — Telehealth: Payer: Self-pay | Admitting: Adult Health

## 2016-11-21 NOTE — Telephone Encounter (Signed)
Will check free T4 with  TSH in 8 weeks

## 2016-11-21 NOTE — Telephone Encounter (Signed)
Patient states you called with lab results but wants to know if she could have t3 free ordered as well. Please call and advise.

## 2016-11-25 ENCOUNTER — Telehealth: Payer: Self-pay | Admitting: Adult Health

## 2016-11-25 NOTE — Telephone Encounter (Signed)
Pt had headache yesterday, she thinks it is related to synthroid, try taking a 1/2 a tab for few days to see if still has headache, and had spot on neck, looks blotchy,wonders if related, wonders if needs endocrinology referral.I can if she wants but wait to see how meds do.

## 2016-12-02 ENCOUNTER — Telehealth: Payer: Self-pay | Admitting: *Deleted

## 2016-12-02 NOTE — Telephone Encounter (Signed)
Patient called stating she is having very heavy periods and at her last visit you mentioned starting her on BCP. Also, she decreased her Synthroid from to but is still having headaches. Please advise.

## 2016-12-04 ENCOUNTER — Encounter: Payer: Self-pay | Admitting: Adult Health

## 2016-12-04 ENCOUNTER — Ambulatory Visit (INDEPENDENT_AMBULATORY_CARE_PROVIDER_SITE_OTHER): Payer: PRIVATE HEALTH INSURANCE | Admitting: Adult Health

## 2016-12-04 VITALS — BP 108/64 | HR 70 | Ht 63.75 in | Wt 152.0 lb

## 2016-12-04 DIAGNOSIS — Z3202 Encounter for pregnancy test, result negative: Secondary | ICD-10-CM

## 2016-12-04 DIAGNOSIS — E039 Hypothyroidism, unspecified: Secondary | ICD-10-CM | POA: Diagnosis not present

## 2016-12-04 DIAGNOSIS — N920 Excessive and frequent menstruation with regular cycle: Secondary | ICD-10-CM | POA: Diagnosis not present

## 2016-12-04 LAB — POCT URINE PREGNANCY: Preg Test, Ur: NEGATIVE

## 2016-12-04 NOTE — Progress Notes (Signed)
Subjective:     Patient ID: Joan Mcgee, female   DOB: [birth year], 36 y.o.   MRN: [id number]  HPI Joan Mcgee is a 36 year old white female in to discuss periods, which last 6 days and 3 are heavy, may change every hour at times.Headaches better now.  Review of Systems Periods are 6 days with 3 heavy Decrease in headaches  Reviewed past medical,surgical, social and family history. Reviewed medications and allergies.     Objective:   Physical Exam BP 108/64 (BP Location: Left Arm, Patient Position: Sitting, Cuff Size: Normal)   Pulse 70   Ht 5' 3.75" (1.619 m)   Wt 152 lb (68.9 kg)   LMP 11/14/2016 (Exact Date)   BMI 26.30 kg/m UPT negative, Talk only.She says headaches better on synthroid now, but wants to discuss period control options.Discussed IUD, depo, ablation and POP, since she has had blood clots in past, were not DVT.   Husband has had vasectomy.  Assessment:     1. Menorrhagia with regular cycle   2. Pregnancy examination or test, negative result   3. Hypothyroidism, unspecified type       Plan:     Get TSH and free T4 in mid May Continue synthroid Review handout on ablation and let me know if wants that or micronor

## 2016-12-05 ENCOUNTER — Telehealth: Payer: Self-pay | Admitting: *Deleted

## 2016-12-05 NOTE — Telephone Encounter (Signed)
Patient states she just wants to take the BCP's right now.

## 2016-12-06 MED ORDER — NORETHINDRONE 0.35 MG PO TABS: 1.0000 | ORAL_TABLET | Freq: Every day | ORAL | 11 refills | Status: DC

## 2016-12-06 NOTE — Telephone Encounter (Signed)
Left message micronor called in, start with period and take same time every day

## 2016-12-11 ENCOUNTER — Ambulatory Visit: Payer: PRIVATE HEALTH INSURANCE | Admitting: Adult Health

## 2017-01-16 LAB — T4, FREE: Free T4: 1.6 ng/dL (ref 0.82–1.77)

## 2017-01-16 LAB — TSH: TSH: 5.18 u[IU]/mL — ABNORMAL HIGH (ref 0.450–4.500)

## 2017-01-20 ENCOUNTER — Telehealth: Payer: Self-pay | Admitting: Adult Health

## 2017-01-20 MED ORDER — LEVOTHYROXINE SODIUM 75 MCG PO TABS: 75.0000 ug | ORAL_TABLET | Freq: Every day | ORAL | 2 refills | Status: DC

## 2017-01-20 NOTE — Telephone Encounter (Signed)
Pt aware of labs and will increase synthroid to 75 mcg, will recheck in 8 weeks

## 2017-02-27 ENCOUNTER — Telehealth: Payer: Self-pay | Admitting: Adult Health

## 2017-02-27 NOTE — Telephone Encounter (Signed)
Pt is moving to [place], and is due to have labs in July to check TSH, just call me and let me know when leaving will check labs before you go.

## 2017-03-10 ENCOUNTER — Telehealth: Payer: Self-pay | Admitting: *Deleted

## 2017-03-11 ENCOUNTER — Ambulatory Visit (INDEPENDENT_AMBULATORY_CARE_PROVIDER_SITE_OTHER): Payer: PRIVATE HEALTH INSURANCE | Admitting: Women's Health

## 2017-03-11 ENCOUNTER — Encounter: Payer: Self-pay | Admitting: Women's Health

## 2017-03-11 ENCOUNTER — Other Ambulatory Visit: Payer: PRIVATE HEALTH INSURANCE

## 2017-03-11 VITALS — BP 110/72 | HR 80 | Ht 63.75 in | Wt 158.5 lb

## 2017-03-11 DIAGNOSIS — N898 Other specified noninflammatory disorders of vagina: Secondary | ICD-10-CM | POA: Diagnosis not present

## 2017-03-11 DIAGNOSIS — B373 Candidiasis of vulva and vagina: Secondary | ICD-10-CM | POA: Diagnosis not present

## 2017-03-11 DIAGNOSIS — E039 Hypothyroidism, unspecified: Secondary | ICD-10-CM

## 2017-03-11 DIAGNOSIS — B3731 Acute candidiasis of vulva and vagina: Secondary | ICD-10-CM

## 2017-03-11 LAB — POCT WET PREP (WET MOUNT)
CLUE CELLS WET PREP WHIFF POC: NEGATIVE
TRICHOMONAS WET PREP HPF POC: ABSENT

## 2017-03-11 MED ORDER — FLUCONAZOLE 150 MG PO TABS: 150.0000 mg | ORAL_TABLET | Freq: Once | ORAL | 0 refills | Status: AC

## 2017-03-11 NOTE — Progress Notes (Signed)
   [facility] Visit  Patient name: Joan Borgshley [place] MRN [id number]  Date of birth: 07/19/1981  CC & HPI:  Joan Mcgee is a 36 y.o. [id number] Caucasian female presenting today for report of vaginal itching/irritation with thick nonodorous d/c x few days. Has had multiple yeast infections in past, feels the same. Moving to [place] soon.  Patient's last menstrual period was 02/21/2017 (exact date). The current method of family planning is oral progesterone-only contraceptive. Last pap 11/05/16 neg w/ -HRHPV  Pertinent History Reviewed:  Medical & Surgical Hx:   Past medical, surgical, family, and social history reviewed in electronic medical record Medications: Reviewed & Updated - see associated section Allergies: Reviewed in electronic medical record  Objective Findings:  Vitals: BP 110/72 (BP Location: Left Arm, Patient Position: Sitting, Cuff Size: Normal)   Pulse 80   Ht 5' 3.75" (1.619 m)   Wt 158 lb 8 oz (71.9 kg)   LMP 02/21/2017 (Exact Date)   BMI 27.42 kg/m  Body mass index is 27.42 kg/m.  Physical Examination: General appearance - alert, well appearing, and in no distress Pelvic - normal external genitalia, cx normal, small amt thick white nonodorous d/c, wet prep few yeast, few wbc's  Results for orders placed or performed in visit on 03/11/17 (from the past 24 hour(s))  POCT Wet Prep Mellody Drown(Wet [place])   Collection Time: 03/11/17  3:44 PM  Result Value Ref Range   Source Wet Prep POC vaginal    WBC, Wet Prep HPF POC few    Bacteria Wet Prep HPF POC None (A) Few   BACTERIA WET PREP MORPHOLOGY POC     Clue Cells Wet Prep HPF POC None None   Clue Cells Wet Prep Whiff POC Negative Whiff    Yeast Wet Prep HPF POC Few    KOH Wet Prep POC     Trichomonas Wet Prep HPF POC Absent Absent     Assessment & Plan:  A:   Mild vaginal candida  P:  Rx diflucan 1 now, 1 in 3d if needed  If not improved let us know, will rx mytrex  F/U in March for physical if prefers to  stay w/ us after move  Marge DuncansBooker, Jacquelina Hewins Randall CNM, [facility]NP-BC 03/11/2017 3:45 PM

## 2017-03-12 LAB — TSH: TSH: 1.3 u[IU]/mL (ref 0.450–4.500)

## 2017-03-13 ENCOUNTER — Telehealth: Payer: Self-pay | Admitting: Adult Health

## 2017-03-13 MED ORDER — LEVOTHYROXINE SODIUM 75 MCG PO TABS: 75.0000 ug | ORAL_TABLET | Freq: Every day | ORAL | 3 refills | Status: DC

## 2017-03-13 NOTE — Telephone Encounter (Signed)
Pt aware TSH 1.300, will refill levothyroxine 75 mcg #30 take 1 daily with 3 refills, [facility]

## 2017-04-18 ENCOUNTER — Telehealth: Payer: Self-pay | Admitting: *Deleted

## 2017-04-18 NOTE — Telephone Encounter (Signed)
Will see in march and repeat labs then, just have pharmacy call if refills needed

## 2017-07-21 ENCOUNTER — Other Ambulatory Visit: Payer: Self-pay | Admitting: Adult Health

## 2017-07-29 ENCOUNTER — Telehealth: Payer: Self-pay | Admitting: Obstetrics & Gynecology

## 2017-07-30 NOTE — Telephone Encounter (Signed)
LMOVM returning call 

## 2017-07-31 NOTE — Telephone Encounter (Signed)
Has some breast tenderness, try B6 50 mg 2-3 daily

## 2017-07-31 NOTE — Telephone Encounter (Signed)
Patient states she has been having breast soreness for the past couple of weeks. She is also still feeling tired. Doesn't know if she needs a visit or just have thyroid checked. Please advise.

## 2017-07-31 NOTE — Telephone Encounter (Signed)
Pt on phone with lender, will try to call her back

## 2017-08-06 ENCOUNTER — Telehealth: Payer: Self-pay | Admitting: Women's Health

## 2017-08-06 NOTE — Telephone Encounter (Signed)
Patient called asking the amount of B6 [facility]. Informed patient 50mg  2-3 times daily. Verbalized understanding.

## 2017-08-06 NOTE — Telephone Encounter (Signed)
Patient called stating that she would like to speak with Tish, Patient did not state the reason why. Please contact pt

## 2017-08-29 ENCOUNTER — Telehealth: Payer: Self-pay | Admitting: Obstetrics & Gynecology

## 2017-08-29 MED ORDER — LEVOTHYROXINE SODIUM 75 MCG PO TABS: 75.0000 ug | ORAL_TABLET | Freq: Every day | ORAL | 0 refills | Status: DC

## 2017-08-29 NOTE — Telephone Encounter (Signed)
Refilled levothyroxine for 90 days.

## 2017-09-03 ENCOUNTER — Encounter: Payer: Self-pay | Admitting: Adult Health

## 2017-09-17 ENCOUNTER — Telehealth: Payer: Self-pay | Admitting: *Deleted

## 2017-09-17 MED ORDER — LEVOTHYROXINE SODIUM 75 MCG PO TABS: 75.0000 ug | ORAL_TABLET | Freq: Every day | ORAL | 0 refills | Status: DC

## 2017-09-17 NOTE — Telephone Encounter (Signed)
Left message refilled levothyroxine

## 2017-09-17 NOTE — Telephone Encounter (Signed)
Patient has moved back to [place]. She has appointment scheduled in April for physical but will be out of Synthroid in March. Asking is she needs to move up appointment for refill or if she just needs lab redraw before then?

## 2017-09-17 NOTE — Addendum Note (Signed)
Addended by: Cyril MourningGRIFFIN, Phiona Ramnauth A on: 09/17/2017 02:52 PM   Modules accepted: Orders

## 2017-11-24 ENCOUNTER — Other Ambulatory Visit: Payer: PRIVATE HEALTH INSURANCE | Admitting: Adult Health

## 2017-12-14 ENCOUNTER — Other Ambulatory Visit: Payer: Self-pay | Admitting: Adult Health

## 2017-12-25 ENCOUNTER — Other Ambulatory Visit: Payer: PRIVATE HEALTH INSURANCE | Admitting: Adult Health

## 2017-12-26 ENCOUNTER — Other Ambulatory Visit: Payer: PRIVATE HEALTH INSURANCE | Admitting: Adult Health

## 2018-01-05 ENCOUNTER — Telehealth: Payer: Self-pay | Admitting: Adult Health

## 2018-01-05 MED ORDER — LEVOTHYROXINE SODIUM 75 MCG PO TABS: 75.0000 ug | ORAL_TABLET | Freq: Every day | ORAL | 3 refills | Status: DC

## 2018-01-05 NOTE — Telephone Encounter (Signed)
Pt aware I got labs copy and they look good, will refill levothyroxine

## 2018-02-09 ENCOUNTER — Other Ambulatory Visit: Payer: PRIVATE HEALTH INSURANCE | Admitting: Adult Health

## 2018-04-27 ENCOUNTER — Other Ambulatory Visit: Payer: Self-pay

## 2018-04-27 ENCOUNTER — Encounter: Payer: Self-pay | Admitting: Adult Health

## 2018-04-27 ENCOUNTER — Ambulatory Visit (INDEPENDENT_AMBULATORY_CARE_PROVIDER_SITE_OTHER): Payer: PRIVATE HEALTH INSURANCE | Admitting: Adult Health

## 2018-04-27 VITALS — BP 116/74 | HR 74 | Ht 63.75 in | Wt 169.0 lb

## 2018-04-27 DIAGNOSIS — Z01419 Encounter for gynecological examination (general) (routine) without abnormal findings: Secondary | ICD-10-CM

## 2018-04-27 DIAGNOSIS — E039 Hypothyroidism, unspecified: Secondary | ICD-10-CM

## 2018-04-27 MED ORDER — IBUPROFEN 800 MG PO TABS: ORAL_TABLET | ORAL | 2 refills | Status: DC

## 2018-04-27 MED ORDER — LEVOTHYROXINE SODIUM 75 MCG PO TABS: 75.0000 ug | ORAL_TABLET | Freq: Every day | ORAL | 3 refills | Status: DC

## 2018-04-27 NOTE — Progress Notes (Signed)
Patient ID: Joan Mcgee, female   DOB: 10/08/1980, 37 y.o.   MRN: [id number] History of Present Illness: Joan Mcgee is a 37 year old white female, married, G3P3, in for a well woman gyn exam, she had a normal pap with negative HPV 11/05/16. PCP is Dr Margo AyeHall.    Current Medications, Allergies, Past Medical History, Past Surgical History, Family History and Social History were reviewed in [facility] electronic medical record.     Review of Systems: Patient denies any headaches, hearing loss, fatigue, blurred vision, shortness of breath, chest pain, abdominal pain, problems with bowel movements, urination, or intercourse. No joint pain or mood swings. Has had breast mass, that came and went, was tender and was before a period.    Physical Exam:BP 116/74 (BP Location: Right Arm, Patient Position: Sitting, Cuff Size: Normal)   Pulse 74   Ht 5' 3.75" (1.619 m)   Wt 169 lb (76.7 kg)   LMP 04/21/2018   BMI 29.24 kg/m  General:  Well developed, well nourished, no acute distress Skin:  Warm and dry Neck:  Midline trachea, normal thyroid, good ROM, no lymphadenopathy Lungs; Clear to auscultation bilaterally Breast:  No dominant palpable mass, retraction, or nipple discharge Cardiovascular: Regular rate and rhythm Abdomen:  Soft, non tender, no hepatosplenomegaly Pelvic:  External genitalia is normal in appearance, no lesions.  The vagina is normal in appearance. Urethra has no lesions or masses. The cervix is bulbous.  Uterus is felt to be normal size, shape, and contour.  No adnexal masses or tenderness noted.Bladder is non tender, no masses felt. Extremities/musculoskeletal:  No swelling or varicosities noted, no clubbing or cyanosis Psych:  No mood changes, alert and cooperative,seems happy PHQ 2 score 0. Examination chaperoned by Francene FindersKim Lancaster RN.  Impression: 1. Encounter for well woman exam with routine gynecological exam   2. Hypothyroidism, unspecified type       Plan: Meds ordered  this encounter  Medications  . levothyroxine (SYNTHROID, LEVOTHROID) 75 MCG tablet    Sig: Take 1 tablet (75 mcg total) by mouth daily before breakfast.    Dispense:  90 tablet    Refill:  3    Order Specific Question:   Supervising Provider    Answer:   Despina HiddenEURE, LUTHER H [[id number]]  . ibuprofen (ADVIL,MOTRIN) 800 MG tablet    Sig: TAKE 1 TABLET(800 MG) BY MOUTH EVERY 8 HOURS AS NEEDED    Dispense:  60 tablet    Refill:  2    Order Specific Question:   Supervising Provider    Answer:   Duane LopeEURE, LUTHER H [[id number]]  TSH next year Physical in 1 year Pap 2021

## 2018-09-01 ENCOUNTER — Telehealth: Payer: Self-pay | Admitting: *Deleted

## 2018-09-01 DIAGNOSIS — E039 Hypothyroidism, unspecified: Secondary | ICD-10-CM

## 2018-09-01 NOTE — Addendum Note (Signed)
Addended by: Cyril MourningGRIFFIN, JENNIFER A on: 09/01/2018 11:20 AM   Modules accepted: Orders

## 2018-09-01 NOTE — Telephone Encounter (Signed)
Joan Mcgee is trying to lose weight, and is working out, can lose 5 and then gain 5, wants thyroid labs checked, it has been a while, will check TSH and free T4, and praised over working out

## 2018-09-01 NOTE — Telephone Encounter (Signed)
Patient called requesting a call back from you. Having problems with her medications and wants to discuss.

## 2018-09-02 LAB — T4, FREE: FREE T4: 1.51 ng/dL (ref 0.82–1.77)

## 2018-09-02 LAB — TSH: TSH: 2.35 u[IU]/mL (ref 0.450–4.500)

## 2018-09-15 ENCOUNTER — Telehealth: Payer: Self-pay | Admitting: *Deleted

## 2018-09-16 NOTE — Telephone Encounter (Signed)
Left message that labs normal but call if needs to talk

## 2018-10-17 ENCOUNTER — Other Ambulatory Visit: Payer: Self-pay | Admitting: Adult Health

## 2018-10-18 LAB — TSH: TSH: 0.332 u[IU]/mL — ABNORMAL LOW (ref 0.450–4.500)

## 2018-10-18 LAB — T4, FREE: Free T4: 1.22 ng/dL (ref 0.82–1.77)

## 2018-11-02 NOTE — Telephone Encounter (Signed)
Note sent to nurse. 

## 2019-02-22 ENCOUNTER — Telehealth: Payer: Self-pay | Admitting: Adult Health

## 2019-02-22 MED ORDER — FLUCONAZOLE 150 MG PO TABS: ORAL_TABLET | ORAL | 3 refills | Status: DC

## 2019-02-22 NOTE — Telephone Encounter (Signed)
Pt would like to see if something for a yeast infection can be sent in for her.

## 2019-02-22 NOTE — Telephone Encounter (Signed)
Spoke with pt. Pt has a yeast infection. Has been going to pool a lot. + vaginal burning. Requesting med. Pt wants to know if there is any was to prevent this. Thanks!! Peck

## 2019-02-22 NOTE — Telephone Encounter (Signed)
rx diflucan for itching, she had progesterone  100 mg 14 days per month, try not to sit in wet suit too long

## 2020-01-17 ENCOUNTER — Telehealth: Payer: Self-pay | Admitting: Adult Health

## 2020-01-17 NOTE — Telephone Encounter (Signed)
Had periods from Monday to Saturday and spotting again, no pain, no sex or BM, in last 48 hours, check with aunts for when they started menopause, and let me know in 3-4 days if still bleeding or not

## 2020-01-17 NOTE — Telephone Encounter (Signed)
Pt stated she has a menstrual cycle that ended Saturday and today she is spotting and is wanting to speak with Victorino Dike regarding.

## 2020-01-24 ENCOUNTER — Ambulatory Visit: Payer: PRIVATE HEALTH INSURANCE | Admitting: Adult Health

## 2020-02-14 ENCOUNTER — Other Ambulatory Visit: Payer: Self-pay | Admitting: Adult Health

## 2020-02-14 MED ORDER — FLUCONAZOLE 150 MG PO TABS: ORAL_TABLET | ORAL | 3 refills | Status: DC

## 2020-02-14 NOTE — Progress Notes (Signed)
Refill diflucan 

## 2020-02-23 ENCOUNTER — Other Ambulatory Visit: Payer: PRIVATE HEALTH INSURANCE | Admitting: Adult Health

## 2020-03-10 ENCOUNTER — Other Ambulatory Visit: Payer: PRIVATE HEALTH INSURANCE | Admitting: Adult Health

## 2020-03-17 ENCOUNTER — Other Ambulatory Visit: Payer: PRIVATE HEALTH INSURANCE | Admitting: Adult Health

## 2020-04-20 DIAGNOSIS — Z23 Encounter for immunization: Secondary | ICD-10-CM | POA: Diagnosis not present

## 2020-05-11 DIAGNOSIS — R635 Abnormal weight gain: Secondary | ICD-10-CM | POA: Diagnosis not present

## 2020-05-11 DIAGNOSIS — E039 Hypothyroidism, unspecified: Secondary | ICD-10-CM | POA: Diagnosis not present

## 2020-05-11 DIAGNOSIS — R790 Abnormal level of blood mineral: Secondary | ICD-10-CM | POA: Diagnosis not present

## 2020-05-11 DIAGNOSIS — E559 Vitamin D deficiency, unspecified: Secondary | ICD-10-CM | POA: Diagnosis not present

## 2020-05-11 DIAGNOSIS — R7989 Other specified abnormal findings of blood chemistry: Secondary | ICD-10-CM | POA: Diagnosis not present

## 2020-05-11 DIAGNOSIS — N946 Dysmenorrhea, unspecified: Secondary | ICD-10-CM | POA: Diagnosis not present

## 2020-05-11 DIAGNOSIS — R5383 Other fatigue: Secondary | ICD-10-CM | POA: Diagnosis not present

## 2020-05-18 DIAGNOSIS — Z23 Encounter for immunization: Secondary | ICD-10-CM | POA: Diagnosis not present

## 2020-06-06 ENCOUNTER — Other Ambulatory Visit: Payer: PRIVATE HEALTH INSURANCE | Admitting: Adult Health

## 2020-06-08 ENCOUNTER — Other Ambulatory Visit: Payer: PRIVATE HEALTH INSURANCE | Admitting: Adult Health

## 2020-07-10 ENCOUNTER — Ambulatory Visit (INDEPENDENT_AMBULATORY_CARE_PROVIDER_SITE_OTHER): Payer: 59 | Admitting: Nurse Practitioner

## 2020-07-10 ENCOUNTER — Telehealth (INDEPENDENT_AMBULATORY_CARE_PROVIDER_SITE_OTHER): Payer: Self-pay | Admitting: Nurse Practitioner

## 2020-07-10 ENCOUNTER — Other Ambulatory Visit: Payer: Self-pay

## 2020-07-10 ENCOUNTER — Encounter (INDEPENDENT_AMBULATORY_CARE_PROVIDER_SITE_OTHER): Payer: Self-pay | Admitting: Nurse Practitioner

## 2020-07-10 VITALS — BP 112/68 | HR 66 | Temp 97.8°F | Ht 62.25 in | Wt 165.8 lb

## 2020-07-10 DIAGNOSIS — M25552 Pain in left hip: Secondary | ICD-10-CM | POA: Diagnosis not present

## 2020-07-10 DIAGNOSIS — M25829 Other specified joint disorders, unspecified elbow: Secondary | ICD-10-CM

## 2020-07-10 MED ORDER — IBUPROFEN 800 MG PO TABS: 800.0000 mg | ORAL_TABLET | Freq: Three times a day (TID) | ORAL | 2 refills | Status: DC | PRN

## 2020-07-10 NOTE — Progress Notes (Signed)
Subjective:  Patient ID: Joan Mcgee, female    DOB: 27-Mar-1981  Age: 39 y.o. MRN: [id number]  CC:  Chief Complaint  Patient presents with  . Establish Care    Discuss place on left hip, knot in inner left arm for about 2 months      HPI  This patient arrives today for the above.  She is here to establish care at this practice.  Her previous primary provider is leaving the area so she is here to establish care at this office.  Generally she feels well, but does have a couple acute complaints as described below.  Left hip pain: She is been experiencing some left hip pain for approximately 2 months.  She does not note any event that triggered the pain.  She tells me she was treated by her chiropractic and the pain is slowly getting better.  Her chiropractic provider felt that it was muscular in nature as opposed to related to the her bones.  She takes ibuprofen as needed and would like a refill on this today.  She tells me that she is able to walk, run, and has not noted any reduction in her range of motion.  Mass to left upper arm: She also noted a mass located on the medial aspect of her elbow.  She tells me it is fixed, but fairly soft.  She feels like the size has waxed and waned.  She is not sure exactly how long its been present.  She denies any pain, rash, or itching to the site.  Of note, she tells me she recently had blood work collected at her previous provider outside the [facility].  She does not have these records available with her today, but tells me she could get copies brought to the office.  Past Medical History:  Diagnosis Date  . Congenital heart defect   . Herpes   . History of blood clots 11/05/2016   Had superficial clots in legs when pregnant,had veins done with laser in past, and has appt with [facility] 3/15  . History of herpes simplex infection 06/27/2014  . Hypothyroid 11/19/2016  . Vaginal itching 06/27/2014  . Varicose veins        Family History  Problem Relation Age of Onset  . Diabetes Mother   . Heart attack Mother        x 2  . Other Father        has a pacemaker  . Endometriosis Sister   . Cancer Paternal Grandmother        colon    Social History   Social History Narrative  . Not on file   Social History   Tobacco Use  . Smoking status: Never Smoker  . Smokeless tobacco: Never Used  Substance Use Topics  . Alcohol use: No     Current Meds  Medication Sig  . ARMOUR THYROID 90 MG tablet Take 90 mg by mouth daily.  . progesterone (PROMETRIUM) 100 MG capsule Take by mouth.  . valACYclovir (VALTREX) 1000 MG tablet Take 1 tablet (1,000 mg total) by mouth daily.    ROS:  See HPI   Objective:   Today's Vitals: BP 112/68   Pulse 66   Temp 97.8 F (36.6 C) (Temporal)   Ht 5' 2.25" (1.581 m)   Wt 165 lb 12.8 oz (75.2 kg)   SpO2 95%   BMI 30.08 kg/m  Vitals with BMI 07/10/2020 04/27/2018 03/11/2017  Height 5'  2.25" 5' 3.75" 5' 3.75"  Weight 165 lbs 13 oz 169 lbs 158 lbs 8 oz  BMI 30.09 29.25 27.5  Systolic 112 116 875  Diastolic 68 74 72  Pulse 66 74 80     Physical Exam Vitals reviewed.  Constitutional:      General: She is not in acute distress.    Appearance: Normal appearance.  HENT:     Head: Normocephalic and atraumatic.  Neck:     Vascular: No carotid bruit.  Cardiovascular:     Rate and Rhythm: Normal rate and regular rhythm.     Pulses: Normal pulses.     Heart sounds: Normal heart sounds.  Pulmonary:     Effort: Pulmonary effort is normal.     Breath sounds: Normal breath sounds.  Skin:    General: Skin is warm and dry.       Neurological:     General: No focal deficit present.     Mental Status: She is alert and oriented to person, place, and time.  Psychiatric:        Mood and Affect: Mood normal.        Behavior: Behavior normal.        Judgment: Judgment normal.          Assessment and Plan   1. Mass of joint of elbow   2. Left hip  pain      Plan: 1.  I think this most likely represents either cyst or lipoma.  The patient is concerned about etiology and would like further investigation of possible.  Will order ultrasound for further evaluation. 2.  This seems to be improving and is not affecting her quality of life or ability to complete activities of daily living.  For now continue to monitor and she will use ibuprofen as needed.  She was told that if her symptoms worsen to let us know we can determine further recommendations at that point.  She tells me she understands.  We will hold off on collecting blood work today until I am able to review copies of blood work from her previous provider.  She tells me she will bring them by later on this week.  Tests ordered Orders Placed This Encounter  Procedures  . [place] LT UPPER EXTREM LTD SOFT TISSUE NON VASCULAR      Meds ordered this encounter  Medications  . ibuprofen (ADVIL) 800 MG tablet    Sig: Take 1 tablet (800 mg total) by mouth every 8 (eight) hours as needed. TAKE 1 TABLET(800 MG) BY MOUTH EVERY 8 HOURS AS NEEDED    Dispense:  90 tablet    Refill:  2    Order Specific Question:   Supervising Provider    Answer:   Wilson Singer [[id number]]    Patient to follow-up in about 1 month for her annual physical exam.  Elenore Paddy, NP

## 2020-07-10 NOTE — Telephone Encounter (Signed)
Ultrasound ordered today during visit. Please make sure this is run through insurance and is subsequently scheduled. Thank you.

## 2020-07-17 ENCOUNTER — Ambulatory Visit (HOSPITAL_COMMUNITY): Admission: RE | Admit: 2020-07-17 | Payer: 59 | Source: Ambulatory Visit

## 2020-07-17 ENCOUNTER — Other Ambulatory Visit: Payer: PRIVATE HEALTH INSURANCE | Admitting: Adult Health

## 2020-07-22 ENCOUNTER — Encounter (INDEPENDENT_AMBULATORY_CARE_PROVIDER_SITE_OTHER): Payer: Self-pay | Admitting: Nurse Practitioner

## 2020-07-23 ENCOUNTER — Other Ambulatory Visit (INDEPENDENT_AMBULATORY_CARE_PROVIDER_SITE_OTHER): Payer: Self-pay | Admitting: Nurse Practitioner

## 2020-07-23 DIAGNOSIS — R2231 Localized swelling, mass and lump, right upper limb: Secondary | ICD-10-CM

## 2020-07-23 NOTE — Progress Notes (Signed)
Joan Mcgee, this patient sent me a message regarding finding a lump to her right axilla. I have ordered u/s. I believe she is already scheduled for imaging for another area of concern on Tuesday (11/23) will you see if this imaging can be added onto the schedule the same day? Thank you.

## 2020-07-24 NOTE — Progress Notes (Signed)
Working w/ scheduling to see if we can get into do same day as other appt. 1st order was for Rt breast. Now you need lft breast?

## 2020-07-25 ENCOUNTER — Other Ambulatory Visit (HOSPITAL_COMMUNITY): Payer: Self-pay | Admitting: Adult Health

## 2020-07-25 ENCOUNTER — Ambulatory Visit (HOSPITAL_COMMUNITY)
Admission: RE | Admit: 2020-07-25 | Discharge: 2020-07-25 | Disposition: A | Discharge status: Home / Self Care | Payer: 59 | Source: Ambulatory Visit | Attending: Nurse Practitioner | Admitting: Nurse Practitioner

## 2020-07-25 ENCOUNTER — Ambulatory Visit (HOSPITAL_COMMUNITY)
Admission: RE | Admit: 2020-07-25 | Discharge: 2020-07-25 | Disposition: A | Discharge status: Home / Self Care | Payer: 59 | Source: Ambulatory Visit | Attending: Adult Health | Admitting: Adult Health

## 2020-07-25 ENCOUNTER — Other Ambulatory Visit: Payer: Self-pay

## 2020-07-25 DIAGNOSIS — M25829 Other specified joint disorders, unspecified elbow: Secondary | ICD-10-CM | POA: Insufficient documentation

## 2020-07-25 DIAGNOSIS — R2231 Localized swelling, mass and lump, right upper limb: Secondary | ICD-10-CM | POA: Insufficient documentation

## 2020-07-25 DIAGNOSIS — N63 Unspecified lump in unspecified breast: Secondary | ICD-10-CM

## 2020-07-25 DIAGNOSIS — R2232 Localized swelling, mass and lump, left upper limb: Secondary | ICD-10-CM | POA: Diagnosis not present

## 2020-07-25 DIAGNOSIS — N6489 Other specified disorders of breast: Secondary | ICD-10-CM | POA: Diagnosis not present

## 2020-07-25 DIAGNOSIS — R928 Other abnormal and inconclusive findings on diagnostic imaging of breast: Secondary | ICD-10-CM | POA: Diagnosis not present

## 2020-07-25 NOTE — Progress Notes (Signed)
So when scheduled yesterday; got the Rad techs to see if this is all correct & they will make sure all images was completed.

## 2020-07-25 NOTE — Progress Notes (Signed)
First is of her left elbow (that is the first imaging order) and then her right axilla. The second image she sent me a mychart request so I haven't gotten to exam her entirely we may need to do a mammogram depending on next time I see her/what the imaging of her axilla shows.

## 2020-08-17 ENCOUNTER — Other Ambulatory Visit: Payer: PRIVATE HEALTH INSURANCE | Admitting: Adult Health

## 2020-08-21 ENCOUNTER — Ambulatory Visit (INDEPENDENT_AMBULATORY_CARE_PROVIDER_SITE_OTHER): Payer: 59 | Admitting: Internal Medicine

## 2020-10-02 ENCOUNTER — Other Ambulatory Visit: Payer: 59 | Admitting: Adult Health

## 2020-10-06 DIAGNOSIS — E663 Overweight: Secondary | ICD-10-CM | POA: Diagnosis not present

## 2020-10-06 DIAGNOSIS — Z0000 Encounter for general adult medical examination without abnormal findings: Secondary | ICD-10-CM | POA: Diagnosis not present

## 2020-10-06 DIAGNOSIS — D508 Other iron deficiency anemias: Secondary | ICD-10-CM | POA: Diagnosis not present

## 2020-10-06 DIAGNOSIS — E039 Hypothyroidism, unspecified: Secondary | ICD-10-CM | POA: Diagnosis not present

## 2020-10-06 DIAGNOSIS — E559 Vitamin D deficiency, unspecified: Secondary | ICD-10-CM | POA: Diagnosis not present

## 2020-10-06 DIAGNOSIS — Z13220 Encounter for screening for lipoid disorders: Secondary | ICD-10-CM | POA: Diagnosis not present

## 2020-10-06 DIAGNOSIS — E538 Deficiency of other specified B group vitamins: Secondary | ICD-10-CM | POA: Diagnosis not present

## 2020-12-21 DIAGNOSIS — F411 Generalized anxiety disorder: Secondary | ICD-10-CM | POA: Diagnosis not present

## 2020-12-21 DIAGNOSIS — F32A Depression, unspecified: Secondary | ICD-10-CM | POA: Diagnosis not present

## 2021-02-09 DIAGNOSIS — F411 Generalized anxiety disorder: Secondary | ICD-10-CM | POA: Diagnosis not present

## 2021-02-09 DIAGNOSIS — Z0000 Encounter for general adult medical examination without abnormal findings: Secondary | ICD-10-CM | POA: Diagnosis not present

## 2021-02-09 DIAGNOSIS — F900 Attention-deficit hyperactivity disorder, predominantly inattentive type: Secondary | ICD-10-CM | POA: Diagnosis not present

## 2021-02-09 DIAGNOSIS — F32A Depression, unspecified: Secondary | ICD-10-CM | POA: Diagnosis not present

## 2021-03-14 IMAGING — US US EXTREM UP*L* LTD
1 series · 11 of 11 positions shown · non-contrast
Comparison: None.

CLINICAL DATA: Painless mass left antecubital area

EXAM:
ULTRASOUND LEFT UPPER EXTREMITY LIMITED
TECHNIQUE: Ultrasound examination of the upper extremity soft tissues was
performed in the area of clinical concern.

[Series 1: us extrem up*left* ltd · 0.06mm/px · 11 acquisitions, 11 frames shown]
[im 1/11]
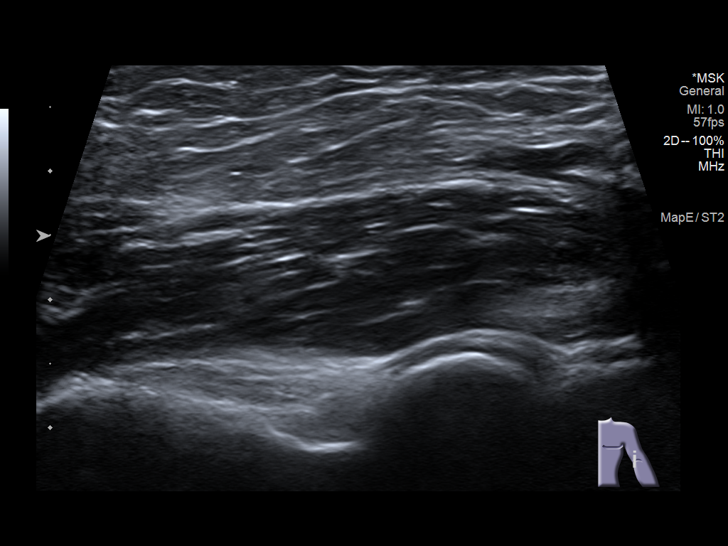
[im 2/11]
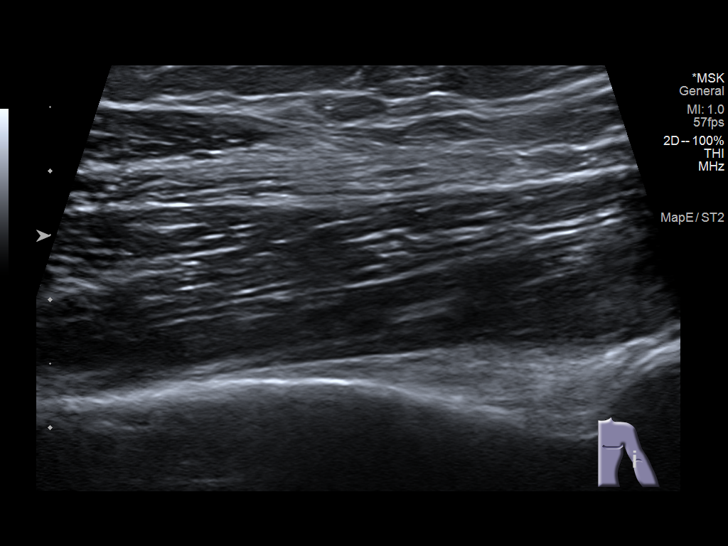
[im 3/11]
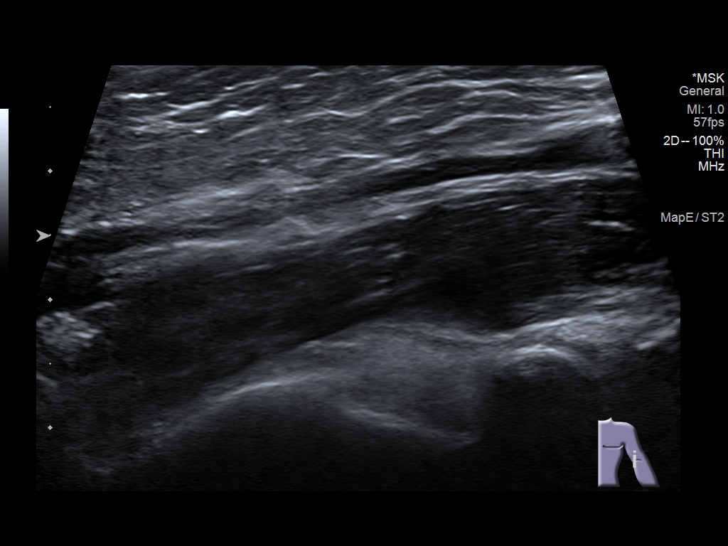
[im 4/11]
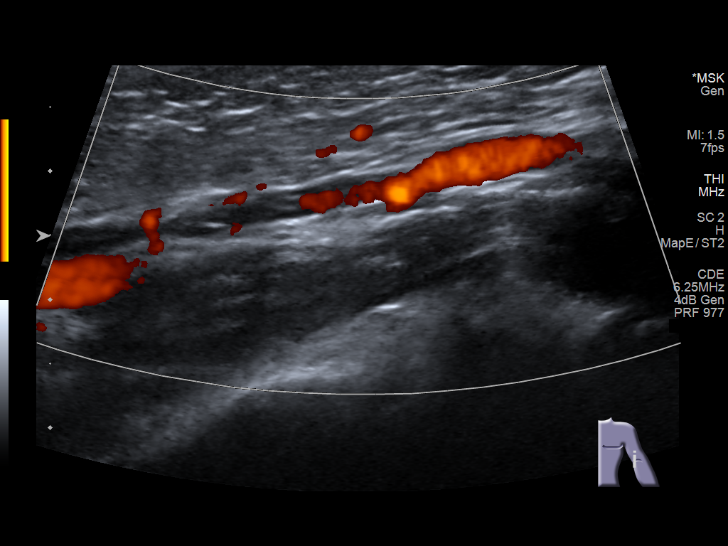
[im 5/11]
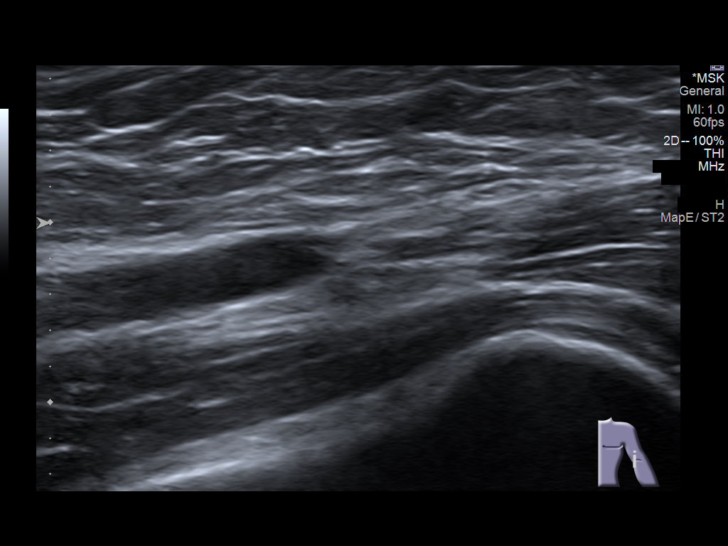
[im 6/11]
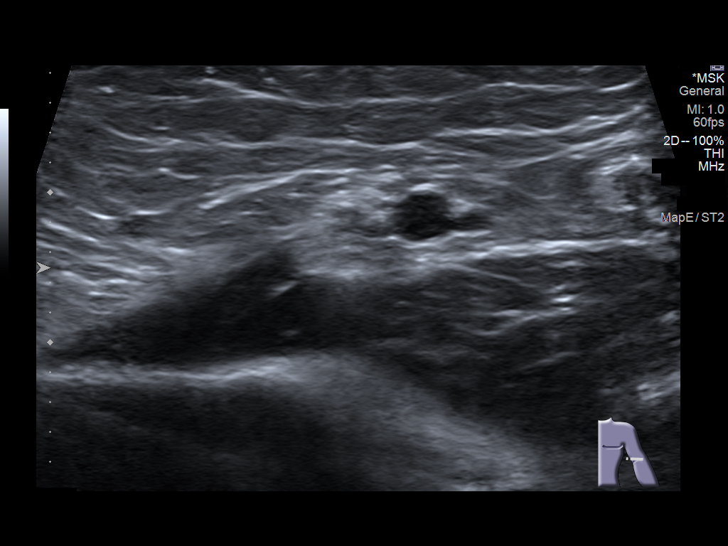
[im 7/11]
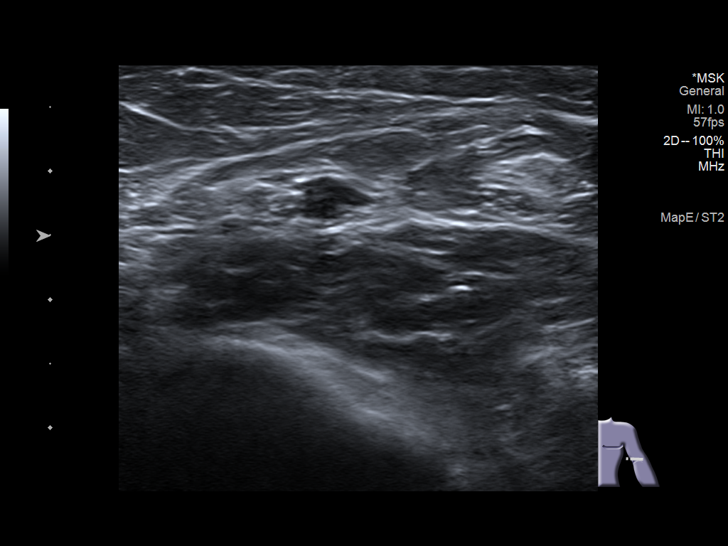
[im 8/11]
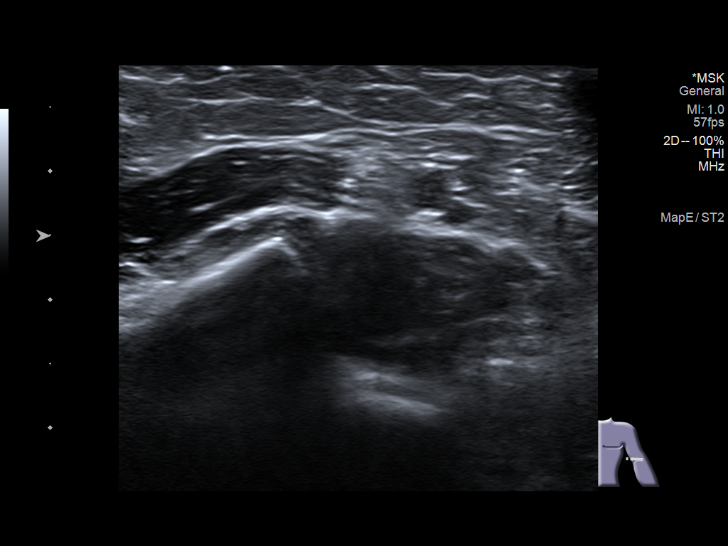
[im 9/11]
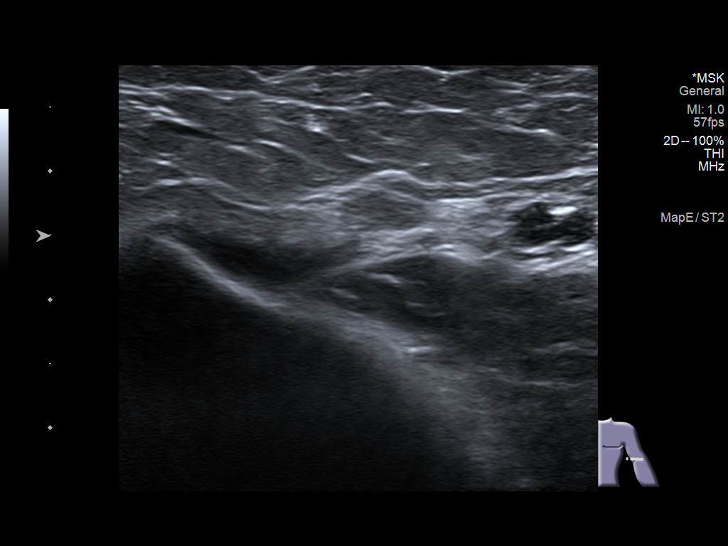
[im 10/11]
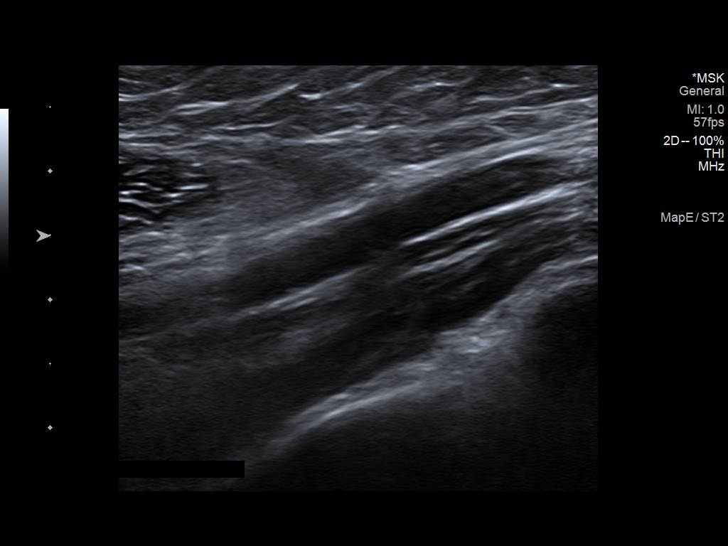
[im 11/11]
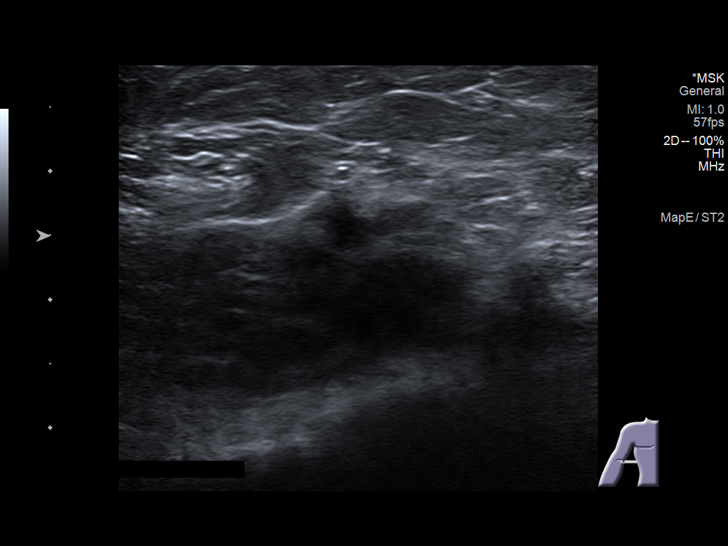

[11 of 11 positions shown; findings below may reference images not displayed]

FINDINGS: Targeted ultrasound was performed with attention to the soft tissues
of the left antecubital region at site of patient's clinical
concern. No soft tissue edema or fluid collection. No solid or
cystic mass is seen within this area. Patent vasculature is noted.
IMPRESSION: No sonographic abnormality within the left antecubital region to
correspond to patient's clinical concern.

## 2021-03-14 IMAGING — MG DIGITAL DIAGNOSTIC BILAT W/ TOMO W/ CAD
6 of 10 series · 6 of 30 positions shown · non-contrast
Comparison: None.

CLINICAL DATA: 39-year-old female with a palpable area of concern
in the right axilla/right upper extremity.

EXAM:
DIGITAL DIAGNOSTIC BILATERAL MAMMOGRAM WITH TOMO AND CAD; ULTRASOUND
RIGHT BREAST LIMITED; ULTRASOUND LEFT BREAST LIMITED

[L MLO synth-2D]
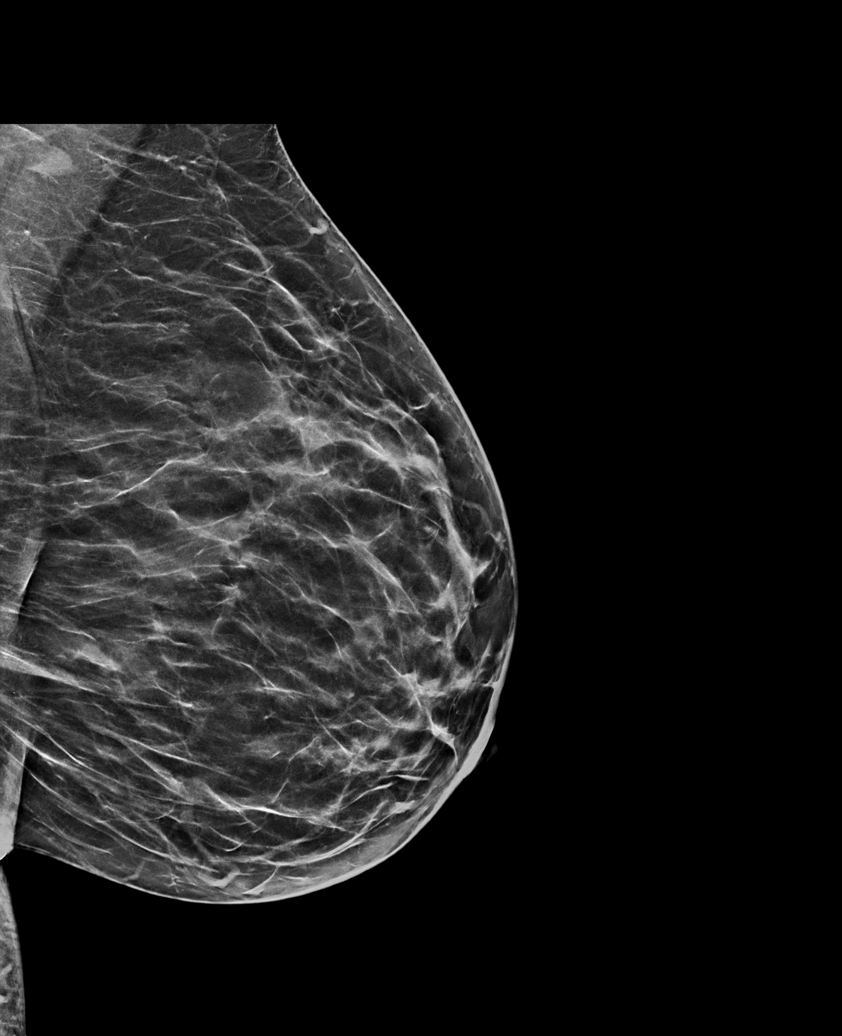

[R MLO synth-2D (1 of 2)]
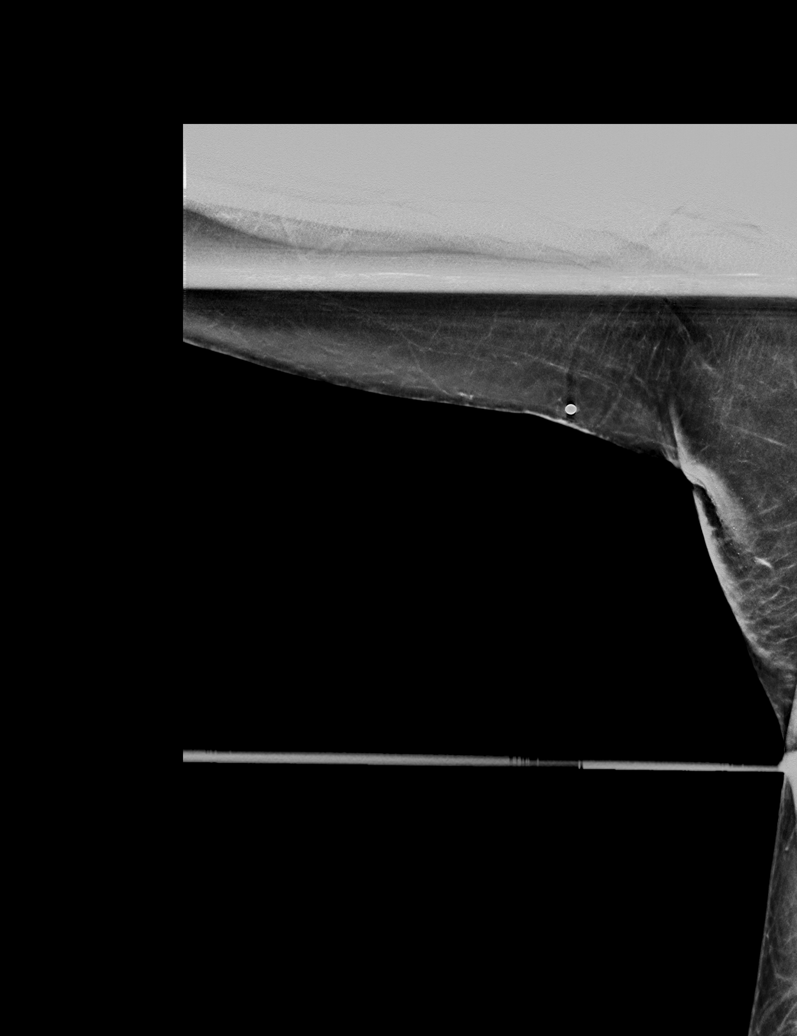

[R MLO synth-2D (2 of 2)]
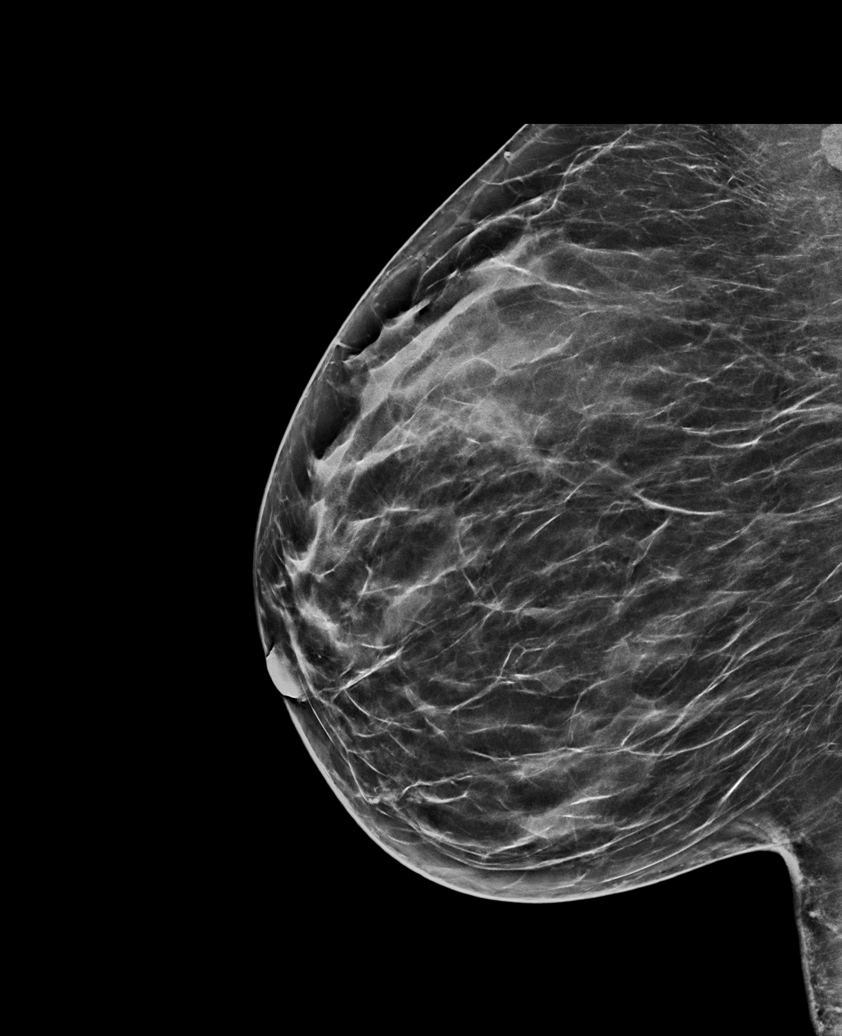

[L CC synth-2D]
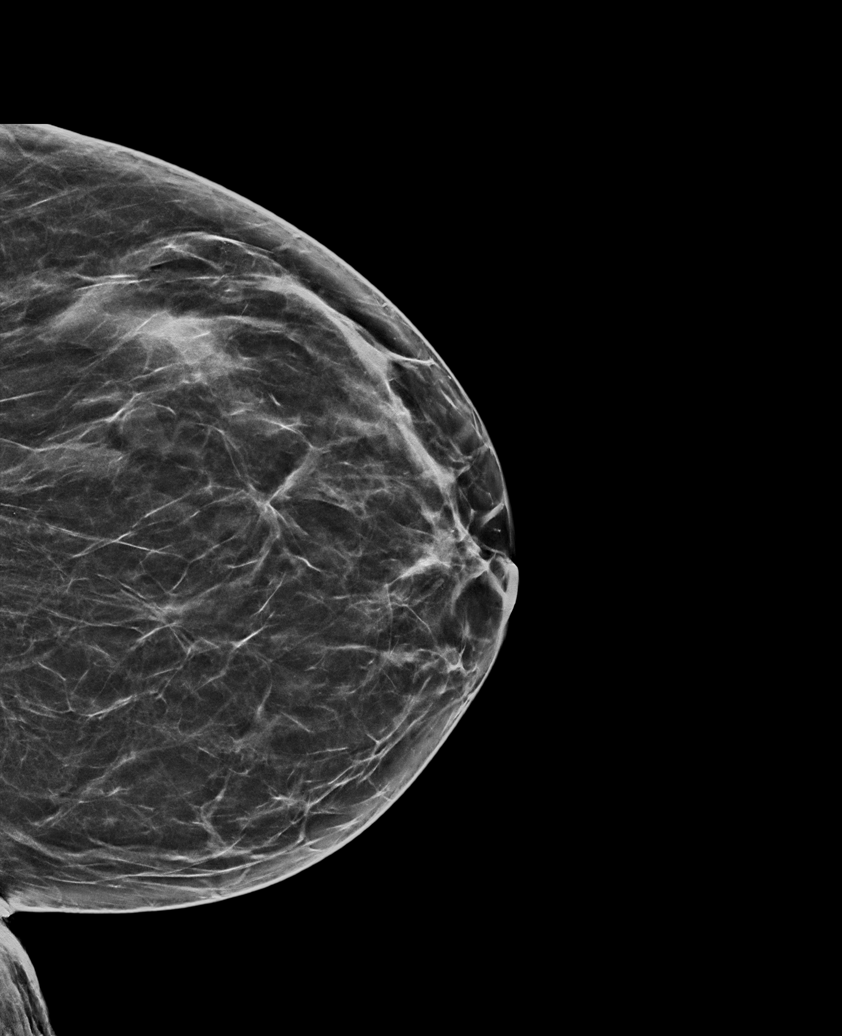

[R CC synth-2D]
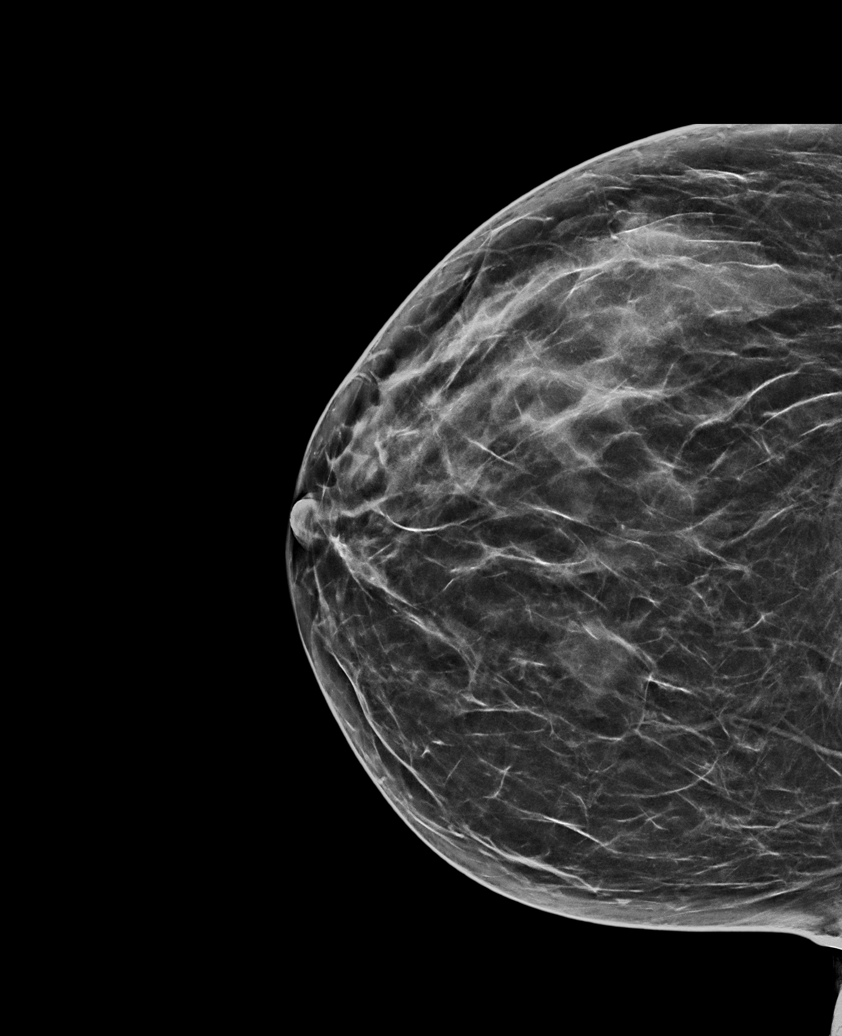

[L CC tomo · tomo slice 31/62.0]
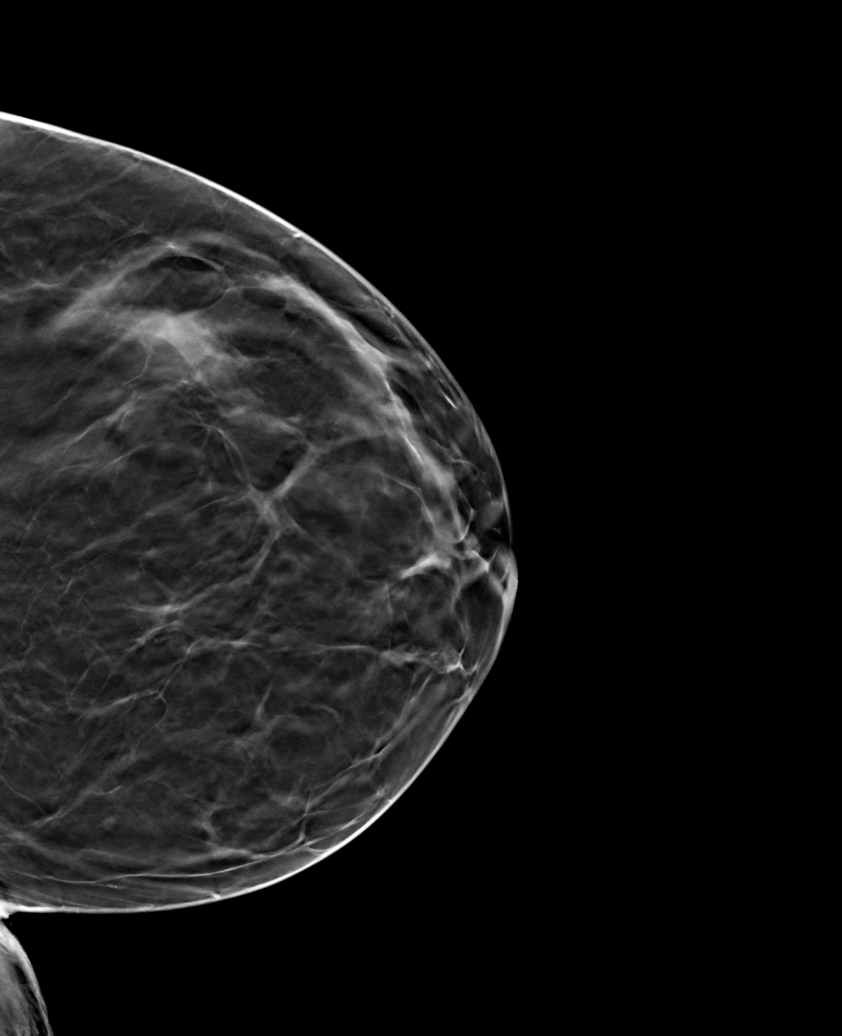

[6 of 30 positions shown; findings below may reference images not displayed]

ACR Breast Density Category b: There are scattered areas of
fibroglandular density.
FINDINGS: No suspicious masses or calcifications are seen in the right breast.
Spot compression MLO tomograms over the palpable area of concern in
the right upper extremity/axilla were performed with no definite
abnormality seen. There is an asymmetry upper-outer left breast felt
to [place] to be related to normal/dense fibroglandular tissue.

Mammographic images were processed with CAD.

Physical examination at site of palpable concern in the high right
axilla does not reveal any discrete palpable masses.

Targeted ultrasound of the right axilla was performed. No suspicious
masses or abnormality seen, only normal-appearing structures
identified.

Targeted ultrasound of the upper-outer left breast was performed. No
suspicious masses or abnormality seen, heterogeneous fibroglandular
tissue identified. The 1 to 3 o'clock position was scanned.
IMPRESSION: 1. No mammographic or sonographic abnormalities at the site of
palpable concern in the high right axilla.

2.  No findings of malignancy in either breast.

RECOMMENDATION:
1. Recommend further management of the right axilla palpable area of
concern be based on clinical assessment.

2.  Screening mammogram in one year.(Code:EQ-M-GKG)

I have discussed the findings and recommendations with the patient.
If applicable, a reminder letter will be sent to the patient
regarding the next appointment.

BI-RADS CATEGORY  1: Negative.

## 2021-03-17 ENCOUNTER — Emergency Department (HOSPITAL_COMMUNITY)
Admission: EM | Admit: 2021-03-17 | Discharge: 2021-03-17 | Disposition: A | Discharge status: Home / Self Care | Payer: 59 | Attending: Emergency Medicine | Admitting: Emergency Medicine

## 2021-03-17 ENCOUNTER — Other Ambulatory Visit: Payer: Self-pay

## 2021-03-17 ENCOUNTER — Encounter (HOSPITAL_COMMUNITY): Payer: Self-pay | Admitting: Emergency Medicine

## 2021-03-17 DIAGNOSIS — Q249 Congenital malformation of heart, unspecified: Secondary | ICD-10-CM | POA: Diagnosis not present

## 2021-03-17 DIAGNOSIS — E039 Hypothyroidism, unspecified: Secondary | ICD-10-CM | POA: Diagnosis not present

## 2021-03-17 DIAGNOSIS — Z79899 Other long term (current) drug therapy: Secondary | ICD-10-CM | POA: Insufficient documentation

## 2021-03-17 DIAGNOSIS — S61250A Open bite of right index finger without damage to nail, initial encounter: Secondary | ICD-10-CM | POA: Diagnosis not present

## 2021-03-17 DIAGNOSIS — W5501XA Bitten by cat, initial encounter: Secondary | ICD-10-CM | POA: Insufficient documentation

## 2021-03-17 DIAGNOSIS — S61230A Puncture wound without foreign body of right index finger without damage to nail, initial encounter: Secondary | ICD-10-CM | POA: Insufficient documentation

## 2021-03-17 DIAGNOSIS — Z2914 Encounter for prophylactic rabies immune globin: Secondary | ICD-10-CM | POA: Diagnosis not present

## 2021-03-17 DIAGNOSIS — Z23 Encounter for immunization: Secondary | ICD-10-CM | POA: Diagnosis not present

## 2021-03-17 DIAGNOSIS — S6991XA Unspecified injury of right wrist, hand and finger(s), initial encounter: Secondary | ICD-10-CM | POA: Diagnosis present

## 2021-03-17 MED ORDER — TETANUS-DIPHTH-ACELL PERTUSSIS 5-2.5-18.5 LF-MCG/0.5 IM SUSY
0.5000 mL | PREFILLED_SYRINGE | Freq: Once | INTRAMUSCULAR | Status: AC
  Administered 2021-03-17: 0.5 mL via INTRAMUSCULAR
  Filled 2021-03-17: qty 0.5

## 2021-03-17 MED ORDER — AMOXICILLIN-POT CLAVULANATE 875-125 MG PO TABS: 1.0000 | ORAL_TABLET | Freq: Two times a day (BID) | ORAL | 0 refills | Status: DC

## 2021-03-17 MED ORDER — RABIES VACCINE, PCEC IM SUSR
1.0000 mL | Freq: Once | INTRAMUSCULAR | Status: AC
  Administered 2021-03-17: 1 mL via INTRAMUSCULAR
  Filled 2021-03-17: qty 1

## 2021-03-17 MED ORDER — BACITRACIN ZINC 500 UNIT/GM EX OINT
TOPICAL_OINTMENT | Freq: Once | CUTANEOUS | Status: AC
  Administered 2021-03-17: 1 via TOPICAL
  Filled 2021-03-17: qty 0.9

## 2021-03-17 MED ORDER — AMOXICILLIN-POT CLAVULANATE 875-125 MG PO TABS
1.0000 | ORAL_TABLET | Freq: Once | ORAL | Status: AC
  Administered 2021-03-17: 1 via ORAL
  Filled 2021-03-17: qty 1

## 2021-03-17 MED ORDER — RABIES IMMUNE GLOBULIN 150 UNIT/ML IM INJ
20.0000 [IU]/kg | INJECTION | Freq: Once | INTRAMUSCULAR | Status: AC
  Administered 2021-03-17: 1500 [IU] via INTRAMUSCULAR
  Filled 2021-03-17: qty 10

## 2021-03-17 NOTE — ED Notes (Signed)
Officer to come talk to patient about animal bite.

## 2021-03-17 NOTE — ED Notes (Signed)
Patient noted to have been given 3000 units of Rabies Immune Globulin IM given. EDP and PA made aware. [facility] called, no toxicology reactions per pharmacists. Possible side effects of medication is anaphylaxis and headaches. Poison Control to be contacted for any possible suggestions for further care.

## 2021-03-17 NOTE — ED Provider Notes (Signed)
[facility] EMERGENCY DEPARTMENT Provider Note   CSN: [id number] Arrival date & time: 03/17/21  1253     History Chief Complaint  Patient presents with   Animal Bite    Joan Mcgee is a 40 y.o. female with past medical history as listed below presents to emergency department today with chief complaint of animal bite from a stray kitten to right index finger that happened earlier this morning. Tetanus is not up to date.  Patient states she had let her dog out and found a stray kitten underneath her grill cover.  When she lifted up the cover the kitten ran away causing patient's dog to chase it.  Patient states she was trying to intervene and picked up the kitten who bit her finger.  She has 2 puncture wounds to right index finger.  She cleaned it immediately.  She called a friend to works with Research scientist (life sciences) and they recommended she come to the ER for evaluation.  Patient did not formally file a report with animal control prior to coming in.  Patient denies any pain.  Denies any fever, chills numbness, tingling or weakness.  No medications for symptoms prior to arrival.    Past Medical History:  Diagnosis Date   Congenital heart defect    Herpes    History of blood clots 11/05/2016   Had superficial clots in legs when pregnant,had veins done with laser in past, and has appt with [facility] 3/15   History of herpes simplex infection 06/27/2014   Hypothyroid 11/19/2016   Vaginal itching 06/27/2014   Varicose veins     Patient Active Problem List   Diagnosis Date Noted   Hypothyroid 11/19/2016   History of blood clots 11/05/2016   Acute mastitis of right breast 08/19/2014   Vaginal itching 06/27/2014   History of herpes simplex infection 06/27/2014    Past Surgical History:  Procedure Laterality Date   CARDIAC SURGERY     as infant and child   veins       OB History     Gravida  3   Para  3   Term  3   Preterm      AB      Living  3      SAB      IAB       Ectopic      Multiple      Live Births  3           Family History  Problem Relation Age of Onset   Diabetes Mother    Heart attack Mother        x 2   Other Father        has a pacemaker   Endometriosis Sister    Cancer Paternal Grandmother        colon    Social History   Tobacco Use   Smoking status: Never   Smokeless tobacco: Never  Vaping Use   Vaping Use: Never used  Substance Use Topics   Alcohol use: No   Drug use: No    Home Medications Prior to Admission medications   Medication Sig Start Date End Date Taking? Authorizing Provider  amoxicillin-clavulanate (AUGMENTIN) 875-125 MG tablet Take 1 tablet by mouth every 12 (twelve) hours. 03/17/21  Yes Walisiewicz, Haniel Fix E, PA-C  ARMOUR THYROID 90 MG tablet Take 90 mg by mouth daily. 04/20/20   [provider]  ibuprofen (ADVIL) 800 MG tablet Take 1 tablet (800 mg  total) by mouth every 8 (eight) hours as needed. TAKE 1 TABLET(800 MG) BY MOUTH EVERY 8 HOURS AS NEEDED 07/10/20   Elenore Paddy, NP  progesterone (PROMETRIUM) 100 MG capsule Take by mouth. 05/24/20   [provider]  valACYclovir (VALTREX) 1000 MG tablet Take 1 tablet (1,000 mg total) by mouth daily. 11/05/16   Adline Potter, NP    Allergies    Patient has no known allergies.  Review of Systems   Review of Systems All other systems are reviewed and are negative for acute change except as noted in the HPI.  Physical Exam Updated Vital Signs BP (!) 133/94 (BP Location: Right Arm)   Pulse 96   Temp 98.8 F (37.1 C) (Oral)   Resp 18   Ht 5\' 2"  (1.575 m)   Wt 75.2 kg   SpO2 98%   BMI 30.32 kg/m   Physical Exam Vitals and nursing note reviewed.  Constitutional:      Appearance: She is well-developed. She is not ill-appearing or toxic-appearing.  HENT:     Head: Normocephalic and atraumatic.     Nose: Nose normal.  Eyes:     General: No scleral icterus.       Right eye: No discharge.        Left eye: No  discharge.     Conjunctiva/sclera: Conjunctivae normal.  Neck:     Vascular: No JVD.  Cardiovascular:     Rate and Rhythm: Normal rate and regular rhythm.     Pulses: Normal pulses.          Radial pulses are 2+ on the right side and 2+ on the left side.     Heart sounds: Normal heart sounds.  Pulmonary:     Effort: Pulmonary effort is normal.     Breath sounds: Normal breath sounds.  Abdominal:     General: There is no distension.  Musculoskeletal:        General: Normal range of motion.     Cervical back: Normal range of motion.     Comments: Full range of motion of right index finger.  Brisk cap refill.  Fingers nontender to palpation.  Skin:    General: Skin is warm and dry.     Comments: 2 puncture wounds on lateral aspect of right index finger.  No active bleeding.  No foreign body appreciated.  No red streaking.  Neurological:     Mental Status: She is oriented to person, place, and time.     GCS: GCS eye subscore is 4. GCS verbal subscore is 5. GCS motor subscore is 6.     Comments: Fluent speech, no facial droop.  Psychiatric:        Behavior: Behavior normal.    ED Results / Procedures / Treatments   Labs (all labs ordered are listed, but only abnormal results are displayed) Labs Reviewed - No data to display  EKG None  Radiology No results found.  Procedures Procedures   Medications Ordered in ED Medications  rabies immune globulin (HYPERAB/KEDRAB) injection 1,500 Units (has no administration in time range)  rabies vaccine (RABAVERT) injection 1 mL (has no administration in time range)  amoxicillin-clavulanate (AUGMENTIN) 875-125 MG per tablet 1 tablet (has no administration in time range)  Tdap (BOOSTRIX) injection 0.5 mL (has no administration in time range)    ED Course  I have reviewed the triage vital signs and the nursing notes.  Pertinent labs & imaging results that were available during my  care of the patient were reviewed by me and considered  in my medical decision making (see chart for details).    MDM Rules/Calculators/A&P                          History provided by patient with additional history obtained from chart review.    Patient presents with puncture wounds from stray cat bite.  Pt wounds irrigated well with 18ga angiocath with sterile saline.  Wounds examined with visualization of the base and no foreign bodies seen.  Pt Alert and oriented, NAD, nontoxic, nonseptic appearing.  Capillary refill intact and pt without neurologic deficit. Patient tetanus updated. She denies chance of pregnancy, politely refuses pregnancy test.  Patient rabies vaccine and immunoglobulin risk and benefit discussed.  Patient is unable to quarantine the cat as it ran away and she has never seen it before, unsure if it belongs to a neighbor or not. Animal control contacted here. Pt consents to treatment here. Pain treated in the emergency department. Wounds not closed secondary to concern for infection. We'll discharge home with Augmentin and requests for close follow-up with PCP or back in the ER. First dose given here.   Portions of this note were generated with Scientist, clinical (histocompatibility and immunogenetics). Dictation errors may occur despite best attempts at proofreading.   Final Clinical Impression(s) / ED Diagnoses Final diagnoses:  Cat bite, initial encounter    Rx / DC Orders ED Discharge Orders          Ordered    amoxicillin-clavulanate (AUGMENTIN) 875-125 MG tablet  Every 12 hours        03/17/21 1326             Shanon Ace, PA-C 03/17/21 1554    Bethann Berkshire, MD 03/19/21 [phone]

## 2021-03-17 NOTE — Discharge Instructions (Signed)
You were given the first antibiotic dose Augmentin in the ER. You were prescribed the rest of the pills (13) which will give you the total of 14.  Continue to clean the wound at least twice daily and apply antibiotic ointment.                                     RABIES VACCINE FOLLOW UP  Patient's Name: Joan Mcgee                     Original Order Date:03/17/2021  Medical Record Number: [id number]  ED Physician: Bethann Berkshire, MD Primary Diagnosis: Rabies Exposure       PCP: Tacy Learn, FNP  Patient Phone Number: (home) [phone] (home)    (cell)  Telephone Information:  Mobile [phone]    (work) There is no work phone number on file. Species of Animal:     You have been seen in the Emergency Department for a possible rabies exposure. It's very important you return for the additional vaccine doses.  Please call the clinic listed below for hours of operation.   Clinic that will administer your rabies vaccines:    DAY 0:  03/17/2021      DAY 3:  03/20/2021       DAY 7:  03/24/2021     DAY 14:  03/31/2021         The 5th vaccine injection is considered for immune compromised patients only.  DAY 28:  04/14/2021

## 2021-03-17 NOTE — ED Notes (Addendum)
Poison Control contacted, report given to May RN. Suggestions are to watch patient x1 hour for normal adverse reactions to Rabies Immune Globulin and supportive care if needed. Patient is able to follow-up with her normal rabies vaccination care. EDPa, Namon Cirri, made aware.

## 2021-03-17 NOTE — ED Triage Notes (Signed)
Pt c/o cat bite today by stray kitten. Bite mark to right index finger.

## 2021-03-17 NOTE — ED Notes (Signed)
Officer in room talking to patient about animal bite.

## 2021-03-17 NOTE — ED Notes (Signed)
[employer] called for animal control.  

## 2021-03-20 ENCOUNTER — Ambulatory Visit
Admission: EM | Admit: 2021-03-20 | Discharge: 2021-03-20 | Disposition: A | Discharge status: Home / Self Care | Payer: 59 | Attending: Urgent Care | Admitting: Urgent Care

## 2021-03-20 ENCOUNTER — Other Ambulatory Visit: Payer: Self-pay

## 2021-03-20 DIAGNOSIS — Z203 Contact with and (suspected) exposure to rabies: Secondary | ICD-10-CM | POA: Diagnosis not present

## 2021-03-20 MED ORDER — RABIES VACCINE, PCEC IM SUSR
1.0000 mL | Freq: Once | INTRAMUSCULAR | Status: AC
  Administered 2021-03-20: 1 mL via INTRAMUSCULAR

## 2021-03-20 NOTE — ED Triage Notes (Signed)
Needs second rabies vaccine.

## 2021-03-25 ENCOUNTER — Ambulatory Visit
Admission: EM | Admit: 2021-03-25 | Discharge: 2021-03-25 | Disposition: A | Discharge status: Home / Self Care | Payer: 59 | Attending: Physician Assistant | Admitting: Physician Assistant

## 2021-03-25 ENCOUNTER — Encounter: Payer: Self-pay | Admitting: Emergency Medicine

## 2021-03-25 ENCOUNTER — Other Ambulatory Visit: Payer: Self-pay

## 2021-03-25 DIAGNOSIS — Z203 Contact with and (suspected) exposure to rabies: Secondary | ICD-10-CM

## 2021-03-25 MED ORDER — RABIES VACCINE, PCEC IM SUSR
1.0000 mL | Freq: Once | INTRAMUSCULAR | Status: AC
  Administered 2021-03-25: 1 mL via INTRAMUSCULAR

## 2021-03-25 NOTE — ED Triage Notes (Signed)
Here for rabies vaccine only

## 2021-03-31 ENCOUNTER — Ambulatory Visit
Admission: EM | Admit: 2021-03-31 | Discharge: 2021-03-31 | Disposition: A | Discharge status: Home / Self Care | Payer: 59 | Attending: Emergency Medicine | Admitting: Emergency Medicine

## 2021-03-31 ENCOUNTER — Other Ambulatory Visit: Payer: Self-pay

## 2021-03-31 DIAGNOSIS — Z23 Encounter for immunization: Secondary | ICD-10-CM

## 2021-03-31 MED ORDER — RABIES VACCINE, PCEC IM SUSR
1.0000 mL | Freq: Once | INTRAMUSCULAR | Status: AC
  Administered 2021-03-31: 1 mL via INTRAMUSCULAR

## 2021-03-31 NOTE — ED Triage Notes (Signed)
Pt presents today to receive Day 14 Rabies Vaccine.

## 2021-05-10 ENCOUNTER — Telehealth: Payer: Self-pay | Admitting: Family Medicine

## 2021-05-10 NOTE — Telephone Encounter (Signed)
This pt is [employer] employee I know we are not taking new pts at this time and she is aware but she said you was highly recommended.

## 2021-05-10 NOTE — Telephone Encounter (Signed)
Ok thank u! I will call her

## 2021-05-21 ENCOUNTER — Encounter: Payer: Self-pay | Admitting: Family Medicine

## 2021-05-21 ENCOUNTER — Telehealth: Payer: Self-pay | Admitting: Adult Health

## 2021-05-21 NOTE — Telephone Encounter (Signed)
Patient called stating that she had her period August 15th-August 20th. It started again on the 22nd, light pink spotting, through the 25th. She wanted to know what Victorino Dike may think is going on. She stated she can't be pregnant, because her husband is fixed. She brought up  that maybe it was menopause.

## 2021-05-22 NOTE — Telephone Encounter (Signed)
Left message that could be menopause, check with mom if she is living to see when she started. Could be thyroid, when was it checked last? Call me back if needed

## 2021-05-24 ENCOUNTER — Telehealth: Payer: Self-pay | Admitting: Family Medicine

## 2021-05-24 NOTE — Telephone Encounter (Signed)
Patient would like to know if you would take her as a new patient. Stated her friend heather elkes recommended you. I did let patient know you were not taking new patients at this time but she wanted to know if I could send a message since she is a [employer] employee. Please advise.

## 2021-05-25 NOTE — Telephone Encounter (Signed)
Appointment made

## 2021-06-08 ENCOUNTER — Ambulatory Visit (INDEPENDENT_AMBULATORY_CARE_PROVIDER_SITE_OTHER): Payer: 59 | Admitting: Physician Assistant

## 2021-06-08 ENCOUNTER — Ambulatory Visit: Payer: PRIVATE HEALTH INSURANCE | Admitting: Family Medicine

## 2021-06-08 ENCOUNTER — Encounter: Payer: Self-pay | Admitting: Physician Assistant

## 2021-06-08 ENCOUNTER — Other Ambulatory Visit: Payer: Self-pay

## 2021-06-08 VITALS — BP 115/72 | HR 84 | Ht 64.0 in | Wt 181.0 lb

## 2021-06-08 DIAGNOSIS — Z6831 Body mass index (BMI) 31.0-31.9, adult: Secondary | ICD-10-CM | POA: Diagnosis not present

## 2021-06-08 DIAGNOSIS — Z13220 Encounter for screening for lipoid disorders: Secondary | ICD-10-CM

## 2021-06-08 DIAGNOSIS — F419 Anxiety disorder, unspecified: Secondary | ICD-10-CM

## 2021-06-08 DIAGNOSIS — Z86718 Personal history of other venous thrombosis and embolism: Secondary | ICD-10-CM

## 2021-06-08 DIAGNOSIS — F988 Other specified behavioral and emotional disorders with onset usually occurring in childhood and adolescence: Secondary | ICD-10-CM

## 2021-06-08 DIAGNOSIS — F32A Depression, unspecified: Secondary | ICD-10-CM

## 2021-06-08 DIAGNOSIS — E039 Hypothyroidism, unspecified: Secondary | ICD-10-CM

## 2021-06-08 DIAGNOSIS — Z131 Encounter for screening for diabetes mellitus: Secondary | ICD-10-CM | POA: Diagnosis not present

## 2021-06-08 DIAGNOSIS — E6609 Other obesity due to excess calories: Secondary | ICD-10-CM | POA: Diagnosis not present

## 2021-06-08 DIAGNOSIS — Z6832 Body mass index (BMI) 32.0-32.9, adult: Secondary | ICD-10-CM | POA: Insufficient documentation

## 2021-06-08 DIAGNOSIS — Z23 Encounter for immunization: Secondary | ICD-10-CM

## 2021-06-08 DIAGNOSIS — E66811 Obesity, class 1: Secondary | ICD-10-CM

## 2021-06-08 MED ORDER — AMPHETAMINE-DEXTROAMPHET ER 30 MG PO CP24: 30.0000 mg | ORAL_CAPSULE | ORAL | 0 refills | Status: DC

## 2021-06-08 MED ORDER — BUPROPION HCL ER (XL) 150 MG PO TB24: 150.0000 mg | ORAL_TABLET | ORAL | 1 refills | Status: DC

## 2021-06-08 NOTE — Progress Notes (Signed)
New Patient Office Visit  Subjective:  Patient ID: Joan Mcgee, female    DOB: 1981-04-01  Age: 40 y.o. MRN: [id number]  CC:  Chief Complaint  Patient presents with   Establish Care    HPI Joan Mcgee presents to establish care.   Patient does wish to get refills on Vyvanse and to discuss her anxiety and depression.  She has not been on any medication since early September.  She loves her job.  She does feel like her stress and anxiety is a problem.  She has tried Lexapro, Prozac, Effexor and just feels like she has had no benefit.  She would like to try Wellbutrin.  She denies any suicidal thoughts or homicidal idealizations.  She denies any present or past history of mania.  She has gained quite a bit of weight in the last few months.  She would like to actively try to lose weight.   Past Medical History:  Diagnosis Date   Congenital heart defect    Herpes    History of blood clots 11/05/2016   Had superficial clots in legs when pregnant,had veins done with laser in past, and has appt with [facility] 3/15   History of herpes simplex infection 06/27/2014   Hypothyroid 11/19/2016   Vaginal itching 06/27/2014   Varicose veins     Past Surgical History:  Procedure Laterality Date   CARDIAC SURGERY     as infant and child   veins      Family History  Problem Relation Age of Onset   Diabetes Mother    Heart attack Mother        x 2   Other Father        has a pacemaker   Endometriosis Sister    Cancer Paternal Grandmother        colon    Social History   Socioeconomic History   Marital status: Married    Spouse name: Not on file   Number of children: Not on file   Years of education: Not on file   Highest education level: Not on file  Occupational History   Not on file  Tobacco Use   Smoking status: Never   Smokeless tobacco: Never  Vaping Use   Vaping Use: Never used  Substance and Sexual Activity   Alcohol use: No   Drug use: No   Sexual  activity: Yes    Birth control/protection: Surgical    Comment: vasectomy  Other Topics Concern   Not on file  Social History Narrative   Not on file   Social Determinants of Health   Financial Resource Strain: Not on file  Food Insecurity: Not on file  Transportation Needs: Not on file  Physical Activity: Not on file  Stress: Not on file  Social Connections: Not on file  Intimate Partner Violence: Not on file    ROS Review of Systems  All other systems reviewed and are negative.  Objective:   Today's Vitals: BP 115/72   Pulse 84   Ht 5\' 4"  (1.626 m)   Wt 181 lb (82.1 kg)   SpO2 100%   BMI 31.07 kg/m   Physical Exam Vitals reviewed.  Constitutional:      Appearance: Normal appearance. She is obese.  HENT:     Head: Normocephalic.  Neck:     Vascular: No carotid bruit.  Cardiovascular:     Rate and Rhythm: Normal rate and regular rhythm.     Pulses: Normal pulses.  Heart sounds: Normal heart sounds.  Pulmonary:     Effort: Pulmonary effort is normal.     Breath sounds: Normal breath sounds.  Musculoskeletal:     Cervical back: No tenderness.  Lymphadenopathy:     Cervical: No cervical adenopathy.  Neurological:     General: No focal deficit present.     Mental Status: She is alert and oriented to person, place, and time.  Psychiatric:        Mood and Affect: Mood normal.   .. Depression screen [facility] 2/9 06/08/2021 04/27/2018 11/05/2016  Decreased Interest 3 0 0  Down, Depressed, Hopeless 2 0 0  PHQ - 2 Score 5 0 0  Altered sleeping 2 - -  Tired, decreased energy 3 - -  Change in appetite 2 - -  Feeling bad or failure about yourself  1 - -  Trouble concentrating 3 - -  Moving slowly or fidgety/restless 1 - -  Suicidal thoughts 0 - -  PHQ-9 Score 17 - -  Difficult doing work/chores Very difficult - -   .Marland Kitchen GAD 7 : Generalized Anxiety Score 06/08/2021  Nervous, Anxious, on Edge 3  Control/stop worrying 2  Worry too much - different things 3   Trouble relaxing 3  Restless 2  Easily annoyed or irritable 3  Afraid - awful might happen 2  Total GAD 7 Score 18  Anxiety Difficulty Very difficult      Assessment & Plan:  Marland KitchenMarland KitchenAliese was seen today for establish care.  Diagnoses and all orders for this visit:  Hypothyroidism, unspecified type -     TSH -     T4, free -     T3, free  History of blood clots -     CBC with Differential/Platelet  Diabetes mellitus screening -     COMPLETE METABOLIC PANEL WITH GFR  Lipid screening -     Lipid Panel w/reflex Direct LDL  Class 1 obesity due to excess calories without serious comorbidity with body mass index (BMI) of 31.0 to 31.9 in adult  Flu vaccine need -     Flu Vaccine QUAD 84mo+IM (Fluarix, Fluzone & Alfiuria Quad PF)  Anxiety and depression -     buPROPion (WELLBUTRIN XL) 150 MG 24 hr tablet; Take 1 tablet (150 mg total) by mouth every morning.  Attention deficit disorder (ADD) without hyperactivity -     amphetamine-dextroamphetamine (ADDERALL XR) 30 MG 24 hr capsule; Take 1 capsule (30 mg total) by mouth every morning. -     amphetamine-dextroamphetamine (ADDERALL XR) 30 MG 24 hr capsule; Take 1 capsule (30 mg total) by mouth every morning. -     amphetamine-dextroamphetamine (ADDERALL XR) 30 MG 24 hr capsule; Take 1 capsule (30 mg total) by mouth every morning.  Needs screening labs.  Printed patient will get when she is fasting.  Refilled Adderall for 3 months.  Start Wellbutrin.  Discussed side effects.    Flu shot given today.   Follow-up in 2 to 3 months.   Follow-up: Return in about 2 months (around 08/08/2021) for Follow up.   Tandy Gaw, PA-C

## 2021-06-08 NOTE — Patient Instructions (Addendum)
Start wellbutrin 

## 2021-06-11 ENCOUNTER — Encounter: Payer: Self-pay | Admitting: Physician Assistant

## 2021-06-13 DIAGNOSIS — E039 Hypothyroidism, unspecified: Secondary | ICD-10-CM | POA: Diagnosis not present

## 2021-06-13 DIAGNOSIS — Z131 Encounter for screening for diabetes mellitus: Secondary | ICD-10-CM | POA: Diagnosis not present

## 2021-06-13 DIAGNOSIS — Z86718 Personal history of other venous thrombosis and embolism: Secondary | ICD-10-CM | POA: Diagnosis not present

## 2021-06-13 DIAGNOSIS — Z13220 Encounter for screening for lipoid disorders: Secondary | ICD-10-CM | POA: Diagnosis not present

## 2021-06-13 LAB — LIPID PANEL W/REFLEX DIRECT LDL
Cholesterol: 166 mg/dL (ref <200)
HDL: 51 mg/dL (ref >=50)
LDL Cholesterol (Calc): 102 mg/dL (calc) — ABNORMAL HIGH
Non-HDL Cholesterol (Calc): 115 mg/dL (calc) (ref <130)
Total CHOL/HDL Ratio: 3.3 (calc) (ref <5.0)
Triglycerides: 51 mg/dL (ref <150)

## 2021-06-13 LAB — COMPLETE METABOLIC PANEL WITH GFR
AG Ratio: 1.9 (calc) (ref 1.0–2.5)
ALT: 18 U/L (ref 6–29)
AST: 17 U/L (ref 10–30)
Albumin: 4.2 g/dL (ref 3.6–5.1)
Alkaline phosphatase (APISO): 52 U/L (ref 31–125)
BUN: 13 mg/dL (ref 7–25)
CO2: 29 mmol/L (ref 20–32)
Calcium: 8.9 mg/dL (ref 8.6–10.2)
Chloride: 106 mmol/L (ref 98–110)
Creat: 0.69 mg/dL (ref 0.50–0.99)
Globulin: 2.2 g/dL (calc) (ref 1.9–3.7)
Glucose, Bld: 99 mg/dL (ref 65–99)
Potassium: 4.4 mmol/L (ref 3.5–5.3)
Sodium: 141 mmol/L (ref 135–146)
Total Bilirubin: 0.3 mg/dL (ref 0.2–1.2)
Total Protein: 6.4 g/dL (ref 6.1–8.1)
eGFR: 112 mL/min/{1.73_m2} (ref >=60)

## 2021-06-13 LAB — CBC WITH DIFFERENTIAL/PLATELET
Absolute Monocytes: 248 cells/uL (ref 200–950)
Basophils Absolute: 22 cells/uL (ref 0–200)
Basophils Relative: 0.6 %
Eosinophils Absolute: 22 cells/uL (ref 15–500)
Eosinophils Relative: 0.6 %
HCT: 38.9 % (ref 35.0–45.0)
Hemoglobin: 13.1 g/dL (ref 11.7–15.5)
Lymphs Abs: 846 cells/uL — ABNORMAL LOW (ref 850–3900)
MCH: 31 pg (ref 27.0–33.0)
MCHC: 33.7 g/dL (ref 32.0–36.0)
MCV: 92.2 fL (ref 80.0–100.0)
MPV: 10.1 fL (ref 7.5–12.5)
Monocytes Relative: 6.9 %
Neutro Abs: 2462 cells/uL (ref 1500–7800)
Neutrophils Relative %: 68.4 %
Platelets: 258 10*3/uL (ref 140–400)
RBC: 4.22 10*6/uL (ref 3.80–5.10)
RDW: 12.7 % (ref 11.0–15.0)
Total Lymphocyte: 23.5 %
WBC: 3.6 10*3/uL — ABNORMAL LOW (ref 3.8–10.8)

## 2021-06-13 LAB — TSH: TSH: 0.31 mIU/L — ABNORMAL LOW

## 2021-06-13 LAB — T4, FREE: Free T4: 1.8 ng/dL (ref 0.8–1.8)

## 2021-06-13 LAB — T3, FREE: T3, Free: 3.3 pg/mL (ref 2.3–4.2)

## 2021-06-18 ENCOUNTER — Encounter: Payer: Self-pay | Admitting: Physician Assistant

## 2021-06-19 NOTE — Progress Notes (Signed)
Joan Mcgee,   TSH is showing more HYPER thyroid but free levels are in normal range but UPPER limits of normal. Are you having any HYPER thyroid symptoms? How do you feel?   WBC just a little low. Lymphocytes low as well. Could be a little virus you are fighting.   Kidney, liver, glucose look good.   HDL, good cholesterol, looks GREAT.  LDL, bad cholesterol, looks good too. Very close to optimal level.

## 2021-07-05 DIAGNOSIS — N951 Menopausal and female climacteric states: Secondary | ICD-10-CM | POA: Diagnosis not present

## 2021-07-05 DIAGNOSIS — E038 Other specified hypothyroidism: Secondary | ICD-10-CM | POA: Diagnosis not present

## 2021-07-13 DIAGNOSIS — Z6833 Body mass index (BMI) 33.0-33.9, adult: Secondary | ICD-10-CM | POA: Diagnosis not present

## 2021-07-13 DIAGNOSIS — N951 Menopausal and female climacteric states: Secondary | ICD-10-CM | POA: Diagnosis not present

## 2021-07-13 DIAGNOSIS — E038 Other specified hypothyroidism: Secondary | ICD-10-CM | POA: Diagnosis not present

## 2021-07-13 DIAGNOSIS — R5383 Other fatigue: Secondary | ICD-10-CM | POA: Diagnosis not present

## 2021-08-01 ENCOUNTER — Encounter: Payer: Self-pay | Admitting: Physician Assistant

## 2021-08-02 ENCOUNTER — Other Ambulatory Visit: Payer: Self-pay | Admitting: Physician Assistant

## 2021-08-02 DIAGNOSIS — F32A Depression, unspecified: Secondary | ICD-10-CM

## 2021-08-02 DIAGNOSIS — F419 Anxiety disorder, unspecified: Secondary | ICD-10-CM

## 2021-08-08 ENCOUNTER — Telehealth (INDEPENDENT_AMBULATORY_CARE_PROVIDER_SITE_OTHER): Payer: 59 | Admitting: Physician Assistant

## 2021-08-08 ENCOUNTER — Encounter: Payer: Self-pay | Admitting: Physician Assistant

## 2021-08-08 VITALS — Ht 64.0 in | Wt 179.0 lb

## 2021-08-08 DIAGNOSIS — R7989 Other specified abnormal findings of blood chemistry: Secondary | ICD-10-CM | POA: Diagnosis not present

## 2021-08-08 DIAGNOSIS — F32A Depression, unspecified: Secondary | ICD-10-CM

## 2021-08-08 DIAGNOSIS — F988 Other specified behavioral and emotional disorders with onset usually occurring in childhood and adolescence: Secondary | ICD-10-CM | POA: Diagnosis not present

## 2021-08-08 DIAGNOSIS — E039 Hypothyroidism, unspecified: Secondary | ICD-10-CM

## 2021-08-08 DIAGNOSIS — F419 Anxiety disorder, unspecified: Secondary | ICD-10-CM

## 2021-08-08 MED ORDER — BUPROPION HCL ER (XL) 150 MG PO TB24: 150.0000 mg | ORAL_TABLET | Freq: Every morning | ORAL | 1 refills | Status: DC

## 2021-08-08 MED ORDER — LIOTHYRONINE SODIUM 5 MCG PO TABS: 5.0000 ug | ORAL_TABLET | Freq: Every day | ORAL | 1 refills | Status: DC

## 2021-08-08 MED ORDER — TESTOSTERONE 20.25 MG/ACT (1.62%) TD GEL: 1.0000 | Freq: Every day | TRANSDERMAL | 1 refills | Status: DC

## 2021-08-08 NOTE — Progress Notes (Signed)
..  Virtual Visit via Video Note  I connected with Joan Mcgee on 08/08/21 at  8:10 AM EST by a video enabled telemedicine application and verified that I am speaking with the correct person using two identifiers.  Location: Patient: home Provider: clinic  .Marland KitchenParticipating in visit:  Patient: Joan Mcgee Provider: Tandy Gaw PA-C Provider in training: Zachery Conch PA-S    I discussed the limitations of evaluation and management by telemedicine and the availability of in person appointments. The patient expressed understanding and agreed to proceed.  History of Present Illness: Pt is a 40 yo female  who presents to the clinic to go over medications and labs.   Pt [facility] and was put on cytomel and testosterone pellets. She could not afford these and would like a cheaper option.   Did not tolerate adderall. Gave her headaches. She is doing well and does not want to try anything else.    .. Active Ambulatory Problems    Diagnosis Date Noted   Vaginal itching 06/27/2014   History of herpes simplex infection 06/27/2014   Acute mastitis of right breast 08/19/2014   History of blood clots 11/05/2016   Hypothyroid 11/19/2016   Class 1 obesity due to excess calories without serious comorbidity with body mass index (BMI) of 31.0 to 31.9 in adult 06/08/2021   Anxiety and depression 06/08/2021   Attention deficit disorder (ADD) without hyperactivity 06/08/2021   Resolved Ambulatory Problems    Diagnosis Date Noted   No Resolved Ambulatory Problems   Past Medical History:  Diagnosis Date   Congenital heart defect    Herpes    Varicose veins     Observations/Objective: No acute distress  Normal mood and appearance  .Marland Kitchen Today's Vitals   08/08/21 0801  Weight: 179 lb (81.2 kg)  Height: 5\' 4"  (1.626 m)   Body mass index is 30.73 kg/m.    Assessment and Plan: Marland KitchenLasundra was seen today for follow-up.  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 -     CP Testosterone, BIO-Female/Children  Anxiety and depression -     buPROPion (WELLBUTRIN XL) 150 MG 24 hr tablet; Take 1 tablet (150 mg total) by mouth every morning.  Hypothyroidism, unspecified type -     liothyronine (CYTOMEL) 5 MCG tablet; Take 1 tablet (5 mcg total) by mouth daily. -     TSH + free T4 -     T3, free  Refilled thyroid medication and wellbutrin.  Testosterone gel added 1 pump a day.  Recheck labs in 6 weeks.    Follow Up Instructions:    I discussed the assessment and treatment plan with the patient. The patient was provided an opportunity to ask questions and all were answered. The patient agreed with the plan and demonstrated an understanding of the instructions.   The patient was advised to call back or seek an in-person evaluation if the symptoms worsen or if the condition fails to improve as anticipated.    Morrie Sheldon, PA-C

## 2021-08-08 NOTE — Progress Notes (Signed)
Following up on Wellbutrin  PHQ9 (7) -GAD7 (3) completed.   Stopped Adderall, gave her headaches, did not want another med for ADHD  Went to hormone doctor, gave her liothyronine 5 mcg - wants refills Told her she needs testosterone supplement, wants to discuss

## 2021-08-10 ENCOUNTER — Telehealth: Payer: Self-pay

## 2021-08-10 NOTE — Telephone Encounter (Signed)
Medication: Testosterone (ANDROGEL PUMP) 20.25 MG/ACT (1.62%) GEL Prior authorization submitted via CoverMyMeds on 08/10/2021 PA submission pending

## 2021-08-14 NOTE — Telephone Encounter (Signed)
Medication: Testosterone (ANDROGEL PUMP) 20.25 MG/ACT (1.62%) GEL Prior authorization submitted via CoverMyMeds on 08/14/2021 PA submission pending  This encounter was created in error - please disregard.

## 2021-08-14 NOTE — Telephone Encounter (Addendum)
Medication: Testosterone (ANDROGEL PUMP) 20.25 MG/ACT (1.62%) GEL Prior authorization submitted via CoverMyMeds on 08/10/2021 PA submission still pending  Pt aware of status

## 2021-08-16 ENCOUNTER — Telehealth: Payer: 59 | Admitting: Physician Assistant

## 2021-08-16 ENCOUNTER — Other Ambulatory Visit (HOSPITAL_COMMUNITY): Payer: Self-pay

## 2021-08-16 DIAGNOSIS — J069 Acute upper respiratory infection, unspecified: Secondary | ICD-10-CM | POA: Diagnosis not present

## 2021-08-16 MED ORDER — BENZONATATE 100 MG PO CAPS: 100.0000 mg | ORAL_CAPSULE | Freq: Three times a day (TID) | ORAL | 0 refills | Status: DC | PRN

## 2021-08-16 MED ORDER — IPRATROPIUM BROMIDE 0.03 % NA SOLN: 2.0000 | Freq: Two times a day (BID) | NASAL | 0 refills | Status: DC

## 2021-08-16 NOTE — Progress Notes (Signed)

## 2021-08-16 NOTE — Telephone Encounter (Signed)
Medication: Testosterone (ANDROGEL PUMP) 20.25 MG/ACT (1.62%) GEL Prior authorization determination received Medication has been denied Reason for denial:  "For the treatment of primary or secondary female hypogonadism (hypotestosteronism or low testosterone), our guideline named TESTOSTERONE (reviewed for testosterone 1.62% gel) requires the following rules be met for approval:  If you are 41 years of age or older, your prostate specific antigen (PSA: lab result that may indicate prostate cancer) has been evaluated for prostate cancer screening"  I am not able to locate a copy of the lab results from [facility] showing her testosterone lab results. She has not had the labs done from her 08/08/21 VV.

## 2021-08-17 ENCOUNTER — Telehealth: Payer: 59 | Admitting: Physician Assistant

## 2021-08-17 DIAGNOSIS — J069 Acute upper respiratory infection, unspecified: Secondary | ICD-10-CM | POA: Diagnosis not present

## 2021-08-17 DIAGNOSIS — R110 Nausea: Secondary | ICD-10-CM

## 2021-08-17 MED ORDER — ONDANSETRON HCL 4 MG PO TABS: 4.0000 mg | ORAL_TABLET | Freq: Three times a day (TID) | ORAL | 0 refills | Status: DC | PRN

## 2021-08-17 NOTE — Patient Instructions (Signed)
Joan Mcgee, thank you for joining Margaretann Loveless, PA-C for today's virtual visit.  While this provider is not your primary care provider (PCP), if your PCP is located in our provider database this encounter information will be shared with them immediately following your visit.  Consent: (Patient) Joan Mcgee provided verbal consent for this virtual visit at the beginning of the encounter.  Current Medications:  Current Outpatient Medications:    ondansetron (ZOFRAN) 4 MG tablet, Take 1 tablet (4 mg total) by mouth every 8 (eight) hours as needed for nausea or vomiting., Disp: 20 tablet, Rfl: 0   benzonatate (TESSALON) 100 MG capsule, Take 1 capsule (100 mg total) by mouth 3 (three) times daily as needed., Disp: 30 capsule, Rfl: 0   buPROPion (WELLBUTRIN XL) 150 MG 24 hr tablet, Take 1 tablet (150 mg total) by mouth every morning., Disp: 90 tablet, Rfl: 1   EUTHYROX 125 MCG tablet, Take 125 mcg by mouth daily., Disp: , Rfl:    ipratropium (ATROVENT) 0.03 % nasal spray, Place 2 sprays into both nostrils every 12 (twelve) hours., Disp: 30 mL, Rfl: 0   liothyronine (CYTOMEL) 5 MCG tablet, Take 1 tablet (5 mcg total) by mouth daily., Disp: 30 tablet, Rfl: 1   MAGNESIUM PO, Take by mouth., Disp: , Rfl:    Probiotic Product (PROBIOTIC-10 PO), Take by mouth., Disp: , Rfl:    Testosterone (ANDROGEL PUMP) 20.25 MG/ACT (1.62%) GEL, Place 1 Pump onto the skin daily., Disp: 75 g, Rfl: 1   vitamin B-12 (CYANOCOBALAMIN) 1000 MCG tablet, Take 1,000 mcg by mouth daily., Disp: , Rfl:    Medications ordered in this encounter:  Meds ordered this encounter  Medications   ondansetron (ZOFRAN) 4 MG tablet    Sig: Take 1 tablet (4 mg total) by mouth every 8 (eight) hours as needed for nausea or vomiting.    Dispense:  20 tablet    Refill:  0    Order Specific Question:   Supervising Provider    Answer:   Hyacinth Meeker, BRIAN [[id number]]     *If you need refills on other medications prior to your next  appointment, please contact your pharmacy*  Follow-Up: Call back or seek an in-person evaluation if the symptoms worsen or if the condition fails to improve as anticipated.  Other Instructions Viral Respiratory Infection A viral respiratory infection is an illness that affects parts of the body that are used for breathing. These include the lungs, nose, and throat. It is caused by a germ called a virus. Some examples of this kind of infection are: A cold. The flu (influenza). A respiratory syncytial virus (RSV) infection. What are the causes? This condition is caused by a virus. It spreads from person to person. You can get the virus if: You breathe in droplets from someone who is sick. You come in contact with people who are sick. You touch mucus or other fluid from a person who is sick. What are the signs or symptoms? Symptoms of this condition include: A stuffy or runny nose. A sore throat. A cough. Shortness of breath. Trouble breathing. Yellow or green fluid in the nose. Other symptoms may include: A fever. Sweating or chills. Tiredness (fatigue). Achy muscles. A headache. How is this treated? This condition may be treated with: Medicines that treat viruses. Medicines that make it easy to breathe. Medicines that are sprayed into the nose. Acetaminophen or NSAIDs, such as ibuprofen, to treat fever. Follow these instructions at home: Managing pain and congestion  Take over-the-counter and prescription medicines only as told by your doctor. If you have a sore throat, gargle with salt water. Do this 3-4 times a day or as needed. To make salt water, dissolve -1 tsp (3-6 g) of salt in 1 cup (237 mL) of warm water. Make sure that all the salt dissolves. Use nose drops made from salt water. This helps with stuffiness (congestion). It also helps soften the skin around your nose. Take 2 tsp (10 mL) of honey at bedtime to lessen coughing at night. Do not give honey to children  who are younger than 24 year old. Drink enough fluid to keep your pee (urine) pale yellow. General instructions  Rest as much as possible. Do not drink alcohol. Do not smoke or use any products that contain nicotine or tobacco. If you need help quitting, ask your doctor. Keep all follow-up visits. How is this prevented?   Get a flu shot every year. Ask your doctor when you should get your flu shot. Do not let other people get your germs. If you are sick: Wash your hands with soap and water often. Wash your hands after you cough or sneeze. Wash hands for at least 20 seconds. If you cannot use soap and water, use hand sanitizer. Cover your mouth when you cough. Cover your nose and mouth when you sneeze. Do not share cups or eating utensils. Clean commonly used objects often. Clean commonly touched surfaces. Stay home from work or school. Avoid contact with people who are sick during cold and flu season. This is in fall and winter. Get help if: Your symptoms last for 10 days or longer. Your symptoms get worse over time. You have very bad pain in your face or forehead. Parts of your jaw or neck get very swollen. You have shortness of breath. Get help right away if: You feel pain or pressure in your chest. You have trouble breathing. You faint or feel like you will faint. You keep vomiting and it gets worse. You feel confused. These symptoms may be an emergency. Get help right away. Call your local emergency services (911 in the [place] Do not wait to see if the symptoms will go away. Do not drive yourself to the hospital. Summary A viral respiratory infection is an illness that affects parts of the body that are used for breathing. Examples of this illness include a cold, the flu, and a respiratory syncytial virus (RSV) infection. The infection can cause a runny nose, cough, sore throat, and fever. Follow what your doctor tells you about taking medicines, drinking lots of fluid,  washing your hands, resting at home, and avoiding people who are sick. This information is not intended to replace advice given to you by your health care provider. Make sure you discuss any questions you have with your health care provider. Document Revised: 11/23/2020 Document Reviewed: 11/23/2020 Elsevier Patient Education  2022 [employer].    If you have been instructed to have an in-person evaluation today at a local Urgent Care facility, please use the link below. It will take you to a list of all of our available [facility], including address, phone number and hours of operation. Please do not delay care.  [facility]  If you or a family member do not have a primary care provider, use the link below to schedule a visit and establish care. When you choose a [facility] primary care physician or advanced practice provider, you gain a  long-term partner in health. Find a Primary Care Provider  Learn more about [facility]'s in-office and virtual care options: [facility] - Get Care Now

## 2021-08-17 NOTE — Progress Notes (Signed)
Virtual Visit Consent   Joan Mcgee, you are scheduled for a virtual visit with a [facility] provider today.     Just as with appointments in the office, your consent must be obtained to participate.  Your consent will be active for this visit and any virtual visit you may have with one of our providers in the next 365 days.     If you have a MyChart account, a copy of this consent can be sent to you electronically.  All virtual visits are billed to your insurance company just like a traditional visit in the office.    As this is a virtual visit, video technology does not allow for your provider to perform a traditional examination.  This may limit your provider's ability to fully assess your condition.  If your provider identifies any concerns that need to be evaluated in person or the need to arrange testing (such as labs, EKG, etc.), we will make arrangements to do so.     Although advances in technology are sophisticated, we cannot ensure that it will always work on either your end or our end.  If the connection with a video visit is poor, the visit may have to be switched to a telephone visit.  With either a video or telephone visit, we are not always able to ensure that we have a secure connection.     I need to obtain your verbal consent now.   Are you willing to proceed with your visit today?    [name] has provided verbal consent on 08/17/2021 for a virtual visit (video or telephone).   Margaretann Loveless, PA-C   Date: 08/17/2021 9:57 AM   Virtual Visit via Video Note   I, Margaretann Loveless, connected with  Joan Mcgee  ([id number], Apr 07, 1981) on 08/17/21 at  9:45 AM EST by a video-enabled telemedicine application and verified that I am speaking with the correct person using two identifiers.  Location: Patient: Virtual Visit Location Patient: Home Provider: Virtual Visit Location Provider: Home Office   I discussed the limitations of evaluation and management by  telemedicine and the availability of in person appointments. The patient expressed understanding and agreed to proceed.    History of Present Illness: Joan Mcgee is a 40 y.o. who identifies as a female who was assigned female at birth, and is being seen today for URI symptoms. Completed e-visit yesterday and was prescribed tessalon perles and ipratropium bromide nasal spray.  HPI: URI  This is a new problem. The current episode started in the past 7 days (Wednesday night). The problem has been gradually worsening. There has been no fever (99). Associated symptoms include congestion, coughing, headaches, rhinorrhea and sinus pain. Pertinent negatives include no nausea or sore throat. Associated symptoms comments: Cold chills, post nasal drainage. She has tried sleep and increased fluids (Cold and flu) for the symptoms.   Was bit by mouse on finger on Tuesday. Did not break skin. Washed with soap and water and soaked in alcohol for 5 min. No tenderness, swelling, redness, or drainage noted.  Problems:  Patient Active Problem List   Diagnosis Date Noted   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 08/08/2021   Class 1 obesity due to excess calories without serious comorbidity with body mass index (BMI) of 31.0 to 31.9 in adult 06/08/2021   Anxiety and depression 06/08/2021   Attention deficit disorder (ADD) without hyperactivity 06/08/2021   Hypothyroid 11/19/2016   History of blood clots 11/05/2016   Acute  mastitis of right breast 08/19/2014   Vaginal itching 06/27/2014   History of herpes simplex infection 06/27/2014    Allergies:  Allergies  Allergen Reactions   Adderall [Amphetamine-Dextroamphetamine]     headaches   Medications:  Current Outpatient Medications:    ondansetron (ZOFRAN) 4 MG tablet, Take 1 tablet (4 mg total) by mouth every 8 (eight) hours as needed for nausea or vomiting., Disp: 20 tablet, Rfl: 0   benzonatate (TESSALON) 100 MG capsule, Take 1 capsule (100 mg total) by mouth  3 (three) times daily as needed., Disp: 30 capsule, Rfl: 0   buPROPion (WELLBUTRIN XL) 150 MG 24 hr tablet, Take 1 tablet (150 mg total) by mouth every morning., Disp: 90 tablet, Rfl: 1   EUTHYROX 125 MCG tablet, Take 125 mcg by mouth daily., Disp: , Rfl:    ipratropium (ATROVENT) 0.03 % nasal spray, Place 2 sprays into both nostrils every 12 (twelve) hours., Disp: 30 mL, Rfl: 0   liothyronine (CYTOMEL) 5 MCG tablet, Take 1 tablet (5 mcg total) by mouth daily., Disp: 30 tablet, Rfl: 1   MAGNESIUM PO, Take by mouth., Disp: , Rfl:    Probiotic Product (PROBIOTIC-10 PO), Take by mouth., Disp: , Rfl:    Testosterone (ANDROGEL PUMP) 20.25 MG/ACT (1.62%) GEL, Place 1 Pump onto the skin daily., Disp: 75 g, Rfl: 1   vitamin B-12 (CYANOCOBALAMIN) 1000 MCG tablet, Take 1,000 mcg by mouth daily., Disp: , Rfl:   Observations/Objective: Patient is well-developed, well-nourished in no acute distress.  Resting comfortably at home.  Head is normocephalic, atraumatic.  No labored breathing.  Speech is clear and coherent with logical content.  Patient is alert and oriented at baseline.    Assessment and Plan: 1. Viral upper respiratory tract infection  2. Nausea - ondansetron (ZOFRAN) 4 MG tablet; Take 1 tablet (4 mg total) by mouth every 8 (eight) hours as needed for nausea or vomiting.  Dispense: 20 tablet; Refill: 0  - Continue symptomatic management of choice OTC - Push fluids - Rest - Seek in person evaluation if worsening or fails to improve  Follow Up Instructions: I discussed the assessment and treatment plan with the patient. The patient was provided an opportunity to ask questions and all were answered. The patient agreed with the plan and demonstrated an understanding of the instructions.  A copy of instructions were sent to the patient via MyChart unless otherwise noted below.    The patient was advised to call back or seek an in-person evaluation if the symptoms worsen or if the  condition fails to improve as anticipated.  Time:  I spent 11 minutes with the patient via telehealth technology discussing the above problems/concerns.    Margaretann Loveless, PA-C

## 2021-09-06 ENCOUNTER — Telehealth: Payer: Self-pay | Admitting: Family Medicine

## 2021-09-06 DIAGNOSIS — R69 Illness, unspecified: Secondary | ICD-10-CM

## 2021-09-06 NOTE — Progress Notes (Signed)
Napoleon  Appt was for son  Needs to have in person or [facility]- advise, it needs to be for him under his name.  Patient acknowledged agreement and understanding of the plan.

## 2021-09-23 ENCOUNTER — Other Ambulatory Visit: Payer: Self-pay | Admitting: Physician Assistant

## 2021-09-23 DIAGNOSIS — E039 Hypothyroidism, unspecified: Secondary | ICD-10-CM

## 2021-10-04 ENCOUNTER — Other Ambulatory Visit: Payer: Self-pay | Admitting: Neurology

## 2021-10-04 DIAGNOSIS — E039 Hypothyroidism, unspecified: Secondary | ICD-10-CM

## 2021-10-04 NOTE — Telephone Encounter (Signed)
Received request for Levothyroxine from [facility].  Patient was seen 08/08/2021 and Cytomel was refilled after she seen by a hormone therapy doctor and asked Luvenia Starch to take over filling RX.  Levothyroxine not written by Luvenia Starch, last labs in October.  Please advise on refill?

## 2021-10-05 MED ORDER — EUTHYROX 125 MCG PO TABS: 125.0000 ug | ORAL_TABLET | Freq: Every day | ORAL | 0 refills | Status: DC

## 2022-01-16 ENCOUNTER — Encounter: Payer: Self-pay | Admitting: Physician Assistant

## 2022-01-30 ENCOUNTER — Encounter: Payer: Self-pay | Admitting: Physician Assistant

## 2022-01-30 ENCOUNTER — Ambulatory Visit (INDEPENDENT_AMBULATORY_CARE_PROVIDER_SITE_OTHER): Payer: No Typology Code available for payment source | Admitting: Physician Assistant

## 2022-01-30 VITALS — BP 116/72 | HR 85 | Ht 64.0 in | Wt 188.0 lb

## 2022-01-30 DIAGNOSIS — Z0000 Encounter for general adult medical examination without abnormal findings: Secondary | ICD-10-CM | POA: Diagnosis not present

## 2022-01-30 DIAGNOSIS — Z6832 Body mass index (BMI) 32.0-32.9, adult: Secondary | ICD-10-CM

## 2022-01-30 DIAGNOSIS — E039 Hypothyroidism, unspecified: Secondary | ICD-10-CM | POA: Diagnosis not present

## 2022-01-30 DIAGNOSIS — Z1231 Encounter for screening mammogram for malignant neoplasm of breast: Secondary | ICD-10-CM | POA: Diagnosis not present

## 2022-01-30 DIAGNOSIS — E6609 Other obesity due to excess calories: Secondary | ICD-10-CM | POA: Diagnosis not present

## 2022-01-30 MED ORDER — METFORMIN HCL ER (MOD) 1000 MG PO TB24: 1000.0000 mg | ORAL_TABLET | Freq: Every day | ORAL | 1 refills | Status: DC

## 2022-01-30 MED ORDER — METFORMIN HCL 500 MG PO TABS: 500.0000 mg | ORAL_TABLET | Freq: Two times a day (BID) | ORAL | 3 refills | Status: DC

## 2022-01-30 MED ORDER — BUPROPION HCL ER (XL) 300 MG PO TB24: 300.0000 mg | ORAL_TABLET | Freq: Every day | ORAL | 3 refills | Status: DC

## 2022-01-30 NOTE — Patient Instructions (Addendum)
[facility] consider  Health Maintenance, Female Adopting a healthy lifestyle and getting preventive care are important in promoting health and wellness. Ask your health care provider about: The right schedule for you to have regular tests and exams. Things you can do on your own to prevent diseases and keep yourself healthy. What should I know about diet, weight, and exercise? Eat a healthy diet  Eat a diet that includes plenty of vegetables, fruits, low-fat dairy products, and lean protein. Do not eat a lot of foods that are high in solid fats, added sugars, or sodium. Maintain a healthy weight Body mass index (BMI) is used to identify weight problems. It estimates body fat based on height and weight. Your health care provider can help determine your BMI and help you achieve or maintain a healthy weight. Get regular exercise Get regular exercise. This is one of the most important things you can do for your health. Most adults should: Exercise for at least 150 minutes each week. The exercise should increase your heart rate and make you sweat (moderate-intensity exercise). Do strengthening exercises at least twice a week. This is in addition to the moderate-intensity exercise. Spend less time sitting. Even light physical activity can be beneficial. Watch cholesterol and blood lipids Have your blood tested for lipids and cholesterol at 42 years of age, then have this test every 5 years. Have your cholesterol levels checked more often if: Your lipid or cholesterol levels are high. You are older than 42 years of age. You are at high risk for heart disease. What should I know about cancer screening? Depending on your health history and family history, you may need to have cancer screening at various ages. This may include screening for: Breast cancer. Cervical cancer. Colorectal cancer. Skin cancer. Lung cancer. What should I know about heart disease, diabetes, and high blood pressure? Blood  pressure and heart disease High blood pressure causes heart disease and increases the risk of stroke. This is more likely to develop in people who have high blood pressure readings or are overweight. Have your blood pressure checked: Every 3-5 years if you are 74-63 years of age. Every year if you are 23 years old or older. Diabetes Have regular diabetes screenings. This checks your fasting blood sugar level. Have the screening done: Once every three years after age 8 if you are at a normal weight and have a low risk for diabetes. More often and at a younger age if you are overweight or have a high risk for diabetes. What should I know about preventing infection? Hepatitis B If you have a higher risk for hepatitis B, you should be screened for this virus. Talk with your health care provider to find out if you are at risk for hepatitis B infection. Hepatitis C Testing is recommended for: Everyone born from [birth year] through 1966. Anyone with known risk factors for hepatitis C. Sexually transmitted infections (STIs) Get screened for STIs, including gonorrhea and chlamydia, if: You are sexually active and are younger than 42 years of age. You are older than 42 years of age and your health care provider tells you that you are at risk for this type of infection. Your sexual activity has changed since you were last screened, and you are at increased risk for chlamydia or gonorrhea. Ask your health care provider if you are at risk. Ask your health care provider about whether you are at high risk for HIV. Your health care provider may recommend a prescription medicine to  help prevent HIV infection. If you choose to take medicine to prevent HIV, you should first get tested for HIV. You should then be tested every 3 months for as long as you are taking the medicine. Pregnancy If you are about to stop having your period (premenopausal) and you may become pregnant, seek counseling before you get  pregnant. Take 400 to 800 micrograms (mcg) of folic acid every day if you become pregnant. Ask for birth control (contraception) if you want to prevent pregnancy. Osteoporosis and menopause Osteoporosis is a disease in which the bones lose minerals and strength with aging. This can result in bone fractures. If you are 70 years old or older, or if you are at risk for osteoporosis and fractures, ask your health care provider if you should: Be screened for bone loss. Take a calcium or vitamin D supplement to lower your risk of fractures. Be given hormone replacement therapy (HRT) to treat symptoms of menopause. Follow these instructions at home: Alcohol use Do not drink alcohol if: Your health care provider tells you not to drink. You are pregnant, may be pregnant, or are planning to become pregnant. If you drink alcohol: Limit how much you have to: 0-1 drink a day. Know how much alcohol is in your drink. In the [place] one drink equals one 12 oz bottle of beer (355 mL), one 5 oz glass of wine (148 mL), or one 1 oz glass of hard liquor (44 mL). Lifestyle Do not use any products that contain nicotine or tobacco. These products include cigarettes, chewing tobacco, and vaping devices, such as e-cigarettes. If you need help quitting, ask your health care provider. Do not use street drugs. Do not share needles. Ask your health care provider for help if you need support or information about quitting drugs. General instructions Schedule regular health, dental, and eye exams. Stay current with your vaccines. Tell your health care provider if: You often feel depressed. You have ever been abused or do not feel safe at home. Summary Adopting a healthy lifestyle and getting preventive care are important in promoting health and wellness. Follow your health care provider's instructions about healthy diet, exercising, and getting tested or screened for diseases. Follow your health care provider's  instructions on monitoring your cholesterol and blood pressure. This information is not intended to replace advice given to you by your health care provider. Make sure you discuss any questions you have with your health care provider. Document Revised: 01/08/2021 Document Reviewed: 01/08/2021 Elsevier Patient Education  [place].

## 2022-01-30 NOTE — Progress Notes (Signed)
Complete physical exam  Patient: Joan Mcgee   DOB: 06/19/1981   41 y.o. Female  MRN: [id number]  Subjective:    Chief Complaint  Patient presents with   Annual Exam    Joan Mcgee is a 41 y.o. female who presents today for a complete physical exam. She reports consuming a  she counts her macros and gets about 1500 calories a day  diet.  She is walking about 2-3 miles a day and lifting weights twice a week.  She generally feels well. She reports sleeping well. She does have additional problems to discuss today. Despite walking, lifting weights, and sleeping good she continues to struggle with her weight. She would like help.   Most recent fall risk assessment:    01/30/2022    9:50 AM  Fall Risk   Falls in the past year? 0  Number falls in past yr: 0  Injury with Fall? 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   Hypothyroid 11/19/2016   History of blood clots 11/05/2016   Acute mastitis of right breast 08/19/2014   Vaginal itching 06/27/2014   History of herpes simplex infection 06/27/2014   Past Medical History:  Diagnosis Date   Congenital heart defect    Herpes    History of blood clots 11/05/2016   Had superficial clots in legs when pregnant,had veins done with laser in past, and has appt with [facility] 3/15   History of herpes simplex infection 06/27/2014   Hypothyroid 11/19/2016   Vaginal  itching 06/27/2014   Varicose veins    Allergies  Allergen Reactions   Adderall [Amphetamine-Dextroamphetamine]     headaches      Patient Care Team: Nolene EbbsBreeback, Trygg Mantz L, PA-C as PCP - General (Family Medicine)   Outpatient Medications Prior to Visit  Medication Sig   buPROPion (WELLBUTRIN XL) 150 MG 24 hr tablet Take 1 tablet (150 mg total) by mouth every morning.   EUTHYROX 125 MCG tablet Take 1 tablet (125 mcg total) by mouth daily.   ipratropium (ATROVENT) 0.03 % nasal spray Place 2 sprays into both nostrils every 12 (twelve) hours.   liothyronine (CYTOMEL) 5 MCG tablet Take 1 tablet (5 mcg total) by mouth daily. LABS FOR FURTHER REFILLS   MAGNESIUM PO Take by mouth.   ondansetron (ZOFRAN) 4 MG tablet Take 1 tablet (4 mg total) by mouth every 8 (eight) hours as needed for nausea or vomiting.   Probiotic Product (PROBIOTIC-10 PO) Take by mouth.   vitamin B-12 (CYANOCOBALAMIN) 1000 MCG tablet Take 1,000 mcg by mouth daily.   [DISCONTINUED] benzonatate (TESSALON) 100 MG capsule Take 1 capsule (100 mg total) by mouth 3 (three) times daily as needed.   [DISCONTINUED] Testosterone (ANDROGEL PUMP) 20.25 MG/ACT (1.62%) GEL Place 1 Pump onto the skin daily.   No  facility-administered medications prior to visit.    Review of Systems  All other systems reviewed and are negative.        Objective:     BP 116/72   Pulse 85   Ht 5\' 4"  (1.626 m)   Wt 188 lb (85.3 kg)   SpO2 98%   BMI 32.27 kg/m  BP Readings from Last 3 Encounters:  01/30/22 116/72  06/08/21 115/72  03/31/21 117/88   Wt Readings from Last 3 Encounters:  01/30/22 188 lb (85.3 kg)  08/08/21 179 lb (81.2 kg)  06/08/21 181 lb (82.1 kg)  ..    01/30/2022   10:16 AM 08/08/2021    8:08 AM 06/08/2021    8:34 AM 04/27/2018    8:30 AM 11/05/2016   11:05 AM  Depression screen PHQ 2/9  Decreased Interest 0 0 3 0 0  Down, Depressed, Hopeless 1 1 2  0 0  PHQ - 2 Score 1 1 5  0 0  Altered sleeping 0 3 2    Tired,  decreased energy 0 3 3    Change in appetite 0 0 2    Feeling bad or failure about yourself  0 0 1    Trouble concentrating 0 0 3    Moving slowly or fidgety/restless 0 0 1    Suicidal thoughts 0 0 0    PHQ-9 Score 1 7 17     Difficult doing work/chores Not difficult at all Somewhat difficult Very difficult     ..    01/30/2022   10:17 AM 08/08/2021    8:09 AM 06/08/2021    8:34 AM  GAD 7 : Generalized Anxiety Score  Nervous, Anxious, on Edge 0 0 3  Control/stop worrying 0 0 2  Worry too much - different things 0 0 3  Trouble relaxing 0 0 3  Restless 0 0 2  Easily annoyed or irritable 1 3 3   Afraid - awful might happen 0 0 2  Total GAD 7 Score 1 3 18   Anxiety Difficulty Not difficult at all Not difficult at all Very difficult        Physical Exam  BP 116/72   Pulse 85   Ht 5\' 4"  (1.626 m)   Wt 188 lb (85.3 kg)   SpO2 98%   BMI 32.27 kg/m   General Appearance:    Alert, cooperative, no distress, appears stated age  Head:    Normocephalic, without obvious abnormality, atraumatic  Eyes:    PERRL, conjunctiva/corneas clear, EOM's intact, fundi    benign, both eyes  Ears:    Normal TM's and external ear canals, both ears  Nose:   Nares normal, septum midline, mucosa normal, no drainage    or sinus tenderness  Throat:   Lips, mucosa, and tongue normal; teeth and gums normal  Neck:   Supple, symmetrical, trachea midline, no adenopathy;    thyroid:  no enlargement/tenderness/nodules; no carotid   bruit or JVD  Back:     Symmetric, no curvature, ROM normal, no CVA tenderness  Lungs:     Clear to auscultation bilaterally, respirations unlabored  Chest Wall:    No tenderness or deformity   Heart:    Regular rate and rhythm, S1 and S2 normal, no murmur, rub   or gallop  Breast Exam:    No tenderness, masses, or nipple abnormality  Abdomen:     Soft, non-tender, bowel sounds active all four quadrants,    no masses, no organomegaly  Extremities:   Extremities normal,  atraumatic, no cyanosis or edema  Pulses:   2+ and symmetric all extremities  Skin:   Skin color, texture, turgor normal, no rashes or lesions  Lymph nodes:   Cervical, supraclavicular, and axillary nodes normal  Neurologic:   CNII-XII intact, normal strength, sensation and reflexes    throughout      Assessment & Plan:    Routine Health Maintenance and Physical Exam  Immunization History  Administered Date(s) Administered   Influenza,inj,Quad PF,6+ Mos 06/08/2021   Influenza,inj,quad, With Preservative 06/22/2020   Moderna Sars-Covid-2 Vaccination 03/16/2020, 06/22/2020   Rabies, IM 03/17/2021, 03/20/2021, 03/25/2021, 03/31/2021   Tdap 03/17/2021    Health Maintenance  Topic Date Due   HIV Screening  Never done   Hepatitis C Screening  Never done   COVID-19 Vaccine (3 - Booster for Moderna series) 02/15/2022 (Originally 08/17/2020)   INFLUENZA VACCINE  04/02/2022   PAP SMEAR-Modifier  01/31/2023   TETANUS/TDAP  03/18/2031   HPV VACCINES  Aged Out    Discussed health benefits of physical activity, and encouraged her to engage in regular exercise appropriate for her age and condition.  Marland KitchenMorrie Sheldon was seen today for annual exam.  Diagnoses and all orders for this visit:  Routine physical examination  Class 1 obesity due to excess calories without serious comorbidity with body mass index (BMI) of 32.0 to 32.9 in adult  Hypothyroidism, unspecified type  Other orders -     Discontinue: metFORMIN (GLUCOPHAGE) 500 MG tablet; Take 1 tablet (500 mg total) by mouth 2 (two) times daily with a meal. -     buPROPion (WELLBUTRIN XL) 300 MG 24 hr tablet; Take 1 tablet (300 mg total) by mouth daily.   .. Discussed 150 minutes of exercise a week.  Encouraged vitamin D 1000 units and Calcium 1300mg  or 4 servings of dairy a day.  Pap UTD Mammogram ordered Phq/GAD no concerns  . Discussed low carb diet with 1500 calories and 80g of protein.  Exercising at least 150 minutes a week.   My Fitness Pal could be a Marland Kitchen.  Discussed wegovy to consider if weight not decreasing  Started wellbutrin with increase and metformin  Follow up in 2 months      Chief Technology Officer, PA-C

## 2022-02-20 ENCOUNTER — Telehealth: Payer: No Typology Code available for payment source | Admitting: Physician Assistant

## 2022-02-20 ENCOUNTER — Encounter: Payer: Self-pay | Admitting: Physician Assistant

## 2022-02-20 DIAGNOSIS — J039 Acute tonsillitis, unspecified: Secondary | ICD-10-CM

## 2022-02-20 MED ORDER — AMOXICILLIN 500 MG PO TABS: 500.0000 mg | ORAL_TABLET | Freq: Two times a day (BID) | ORAL | 0 refills | Status: AC

## 2022-02-20 MED ORDER — FLUCONAZOLE 150 MG PO TABS: 150.0000 mg | ORAL_TABLET | Freq: Once | ORAL | 0 refills | Status: AC

## 2022-02-20 NOTE — Patient Instructions (Signed)
Biagio Borg, thank you for joining Piedad Climes, PA-C for today's virtual visit.  While this provider is not your primary care provider (PCP), if your PCP is located in our provider database this encounter information will be shared with them immediately following your visit.  Consent: (Patient) Joan Mcgee provided verbal consent for this virtual visit at the beginning of the encounter.  Current Medications:  Current Outpatient Medications:    buPROPion (WELLBUTRIN XL) 150 MG 24 hr tablet, Take 1 tablet (150 mg total) by mouth every morning., Disp: 90 tablet, Rfl: 1   buPROPion (WELLBUTRIN XL) 300 MG 24 hr tablet, Take 1 tablet (300 mg total) by mouth daily., Disp: 90 tablet, Rfl: 3   EUTHYROX 125 MCG tablet, Take 1 tablet (125 mcg total) by mouth daily., Disp: 90 tablet, Rfl: 0   ipratropium (ATROVENT) 0.03 % nasal spray, Place 2 sprays into both nostrils every 12 (twelve) hours., Disp: 30 mL, Rfl: 0   liothyronine (CYTOMEL) 5 MCG tablet, Take 1 tablet (5 mcg total) by mouth daily. LABS FOR FURTHER REFILLS, Disp: 30 tablet, Rfl: 0   MAGNESIUM PO, Take by mouth., Disp: , Rfl:    metFORMIN (GLUMETZA) 1000 MG (MOD) 24 hr tablet, Take 1 tablet (1,000 mg total) by mouth daily with breakfast., Disp: 90 tablet, Rfl: 1   ondansetron (ZOFRAN) 4 MG tablet, Take 1 tablet (4 mg total) by mouth every 8 (eight) hours as needed for nausea or vomiting., Disp: 20 tablet, Rfl: 0   Probiotic Product (PROBIOTIC-10 PO), Take by mouth., Disp: , Rfl:    vitamin B-12 (CYANOCOBALAMIN) 1000 MCG tablet, Take 1,000 mcg by mouth daily., Disp: , Rfl:    Medications ordered in this encounter:  No orders of the defined types were placed in this encounter.    *If you need refills on other medications prior to your next appointment, please contact your pharmacy*  Follow-Up: Call back or seek an in-person evaluation if the symptoms worsen or if the condition fails to improve as anticipated.  Other  Instructions  We are sorry that you are not feeling well.  Here is how we plan to help!  Based on what you have shared with me it is likely that you have strep pharyngitis.  Strep pharyngitis is inflammation and infection in the back of the throat.  This is an infection cause by bacteria and is treated with antibiotics.  I have prescribed Amoxicillin 500 mg twice a day for 10 days. For throat pain, we recommend over the counter oral pain relief medications such as acetaminophen or aspirin, or anti-inflammatory medications such as ibuprofen or naproxen sodium. Topical treatments such as oral throat lozenges or sprays may be used as needed. Strep infections are not as easily transmitted as other respiratory infections, however we still recommend that you avoid close contact with loved ones, especially the very young and elderly.  Remember to wash your hands thoroughly throughout the day as this is the number one way to prevent the spread of infection and wipe down door knobs and counters with disinfectant.   Home Care: Only take medications as instructed by your medical team. Complete the entire course of an antibiotic. Do not take these medications with alcohol. A steam or ultrasonic humidifier can help congestion.  You can place a towel over your head and breathe in the steam from hot water coming from a faucet. Avoid close contacts especially the very young and the elderly. Cover your mouth when you cough or sneeze.  Always remember to wash your hands.  Get Help Right Away If: You develop worsening fever or sinus pain. You develop a severe head ache or visual changes. Your symptoms persist after you have completed your treatment plan.  Make sure you Understand these instructions. Will watch your condition. Will get help right away if you are not doing well or get worse.   If you have been instructed to have an in-person evaluation today at a local Urgent Care facility, please use the link  below. It will take you to a list of all of our available [facility], including address, phone number and hours of operation. Please do not delay care.  [facility]  If you or a family member do not have a primary care provider, use the link below to schedule a visit and establish care. When you choose a [facility] primary care physician or advanced practice provider, you gain a long-term partner in health. Find a Primary Care Provider  Learn more about [facility] in-office and virtual care options: [facility] - Get Care Now

## 2022-02-20 NOTE — Progress Notes (Signed)
Virtual Visit Consent   Joan Mcgee, you are scheduled for a virtual visit with a [facility] provider today. Just as with appointments in the office, your consent must be obtained to participate. Your consent will be active for this visit and any virtual visit you may have with one of our providers in the next 365 days. If you have a MyChart account, a copy of this consent can be sent to you electronically.  As this is a virtual visit, video technology does not allow for your provider to perform a traditional examination. This may limit your provider's ability to fully assess your condition. If your provider identifies any concerns that need to be evaluated in person or the need to arrange testing (such as labs, EKG, etc.), we will make arrangements to do so. Although advances in technology are sophisticated, we cannot ensure that it will always work on either your end or our end. If the connection with a video visit is poor, the visit may have to be switched to a telephone visit. With either a video or telephone visit, we are not always able to ensure that we have a secure connection.  By engaging in this virtual visit, you consent to the provision of healthcare and authorize for your insurance to be billed (if applicable) for the services provided during this visit. Depending on your insurance coverage, you may receive a charge related to this service.  I need to obtain your verbal consent now. Are you willing to proceed with your visit today? Willella Harding has provided verbal consent on 02/20/2022 for a virtual visit (video or telephone). Joan Mcgee, [place]  Date: 02/20/2022 9:24 AM  Virtual Visit via Video Note   I, Joan Mcgee, connected with  Latanya Hemmer  ([id number], [birth year]) on 02/20/22 at  9:15 AM EDT by a video-enabled telemedicine application and verified that I am speaking with the correct person using two identifiers.  Location: Patient: Virtual Visit Location Patient:  Home Provider: Virtual Visit Location Provider: Home Office   I discussed the limitations of evaluation and management by telemedicine and the availability of in person appointments. The patient expressed understanding and agreed to proceed.    History of Present Illness: Joan Mcgee is a 41 y.o. who identifies as a female who was assigned female at birth, and is being seen today for possible strep throat. Notes symptoms really kicking in yesterday with significant sore throat with swelling of tonsils and now noting white patches. Has had some PND but has seasonal allergies. Notes low-grade fever and some mild aches. Denies sinus pain, ear pain or tooth pain. Daughter recently treated for strep throat.  Has been taking Mucinex for her PND.  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 Repeat in 3 days if needed., Disp: 2 tablet, Rfl: 0   buPROPion (WELLBUTRIN XL) 150 MG 24 hr tablet, Take 1 tablet (150 mg total) by mouth every morning., Disp: 90 tablet, Rfl: 1   buPROPion (WELLBUTRIN XL) 300 MG 24 hr tablet, Take 1 tablet  (300 mg total) by mouth daily., Disp: 90 tablet, Rfl: 3   EUTHYROX 125 MCG tablet, Take 1 tablet (125 mcg total) by mouth daily., Disp: 90 tablet, Rfl: 0   liothyronine (CYTOMEL) 5 MCG tablet, Take 1 tablet (5 mcg total) by mouth daily. LABS FOR FURTHER REFILLS, Disp: 30 tablet, Rfl: 0   MAGNESIUM PO, Take by mouth., Disp: , Rfl:    metFORMIN (GLUMETZA) 1000 MG (MOD) 24 hr tablet, Take 1 tablet (1,000 mg total) by mouth daily with breakfast., Disp: 90 tablet, Rfl: 1   Probiotic Product (PROBIOTIC-10 PO), Take by mouth., Disp: , Rfl:    vitamin B-12 (CYANOCOBALAMIN) 1000 MCG tablet, Take 1,000 mcg by mouth daily., Disp: , Rfl:   Observations/Objective: Patient is well-developed, well-nourished in no acute distress.  Resting comfortably at home.  Head is normocephalic, atraumatic.  No labored breathing. Speech is clear and coherent with logical content.  Patient is alert and oriented at baseline.  Posterior oropharyngeal erythema with some tonsillar swelling   Assessment and Plan: 1. Exudative tonsillitis - amoxicillin (AMOXIL) 500 MG tablet; Take 1 tablet (500 mg total) by mouth 2 (two) times daily for 10 days.  Dispense: 20 tablet; Refill: 0  Concern for strep giving recent exposure, tonsillar exudate and no other new URI symptoms. Supportive measures and OTC medications reviewed. Recommend antihistamine for some drainage she has had from likely allergic inflammation. Start Amox 500 mg BID x 10 days. Diflucan sent in in case of antibiotic-induced yeast infection as she has a history of this.   Follow Up Instructions: I discussed the assessment and treatment plan with the patient. The patient was provided an opportunity to ask questions and all were answered. The patient agreed with the plan and demonstrated an understanding of the instructions.  A copy of instructions were sent to the patient via MyChart unless otherwise noted below.   The patient was advised to call back or seek an  in-person evaluation if the symptoms worsen or if the condition fails to improve as anticipated.  Time:  I spent 9 minutes with the patient via telehealth technology discussing the above problems/concerns.    Joan Climes, PA-C

## 2022-04-09 ENCOUNTER — Encounter: Payer: Self-pay | Admitting: Physician Assistant

## 2022-04-09 DIAGNOSIS — E039 Hypothyroidism, unspecified: Secondary | ICD-10-CM

## 2022-04-10 MED ORDER — EUTHYROX 125 MCG PO TABS: 125.0000 ug | ORAL_TABLET | Freq: Every day | ORAL | 0 refills | Status: DC

## 2022-04-10 NOTE — Telephone Encounter (Signed)
Refill for levothyroxine sent to pharmacy.  Please review remainder of pt's message.  Tiajuana Amass, CMA

## 2022-04-19 ENCOUNTER — Other Ambulatory Visit: Payer: Self-pay | Admitting: Physician Assistant

## 2022-04-19 DIAGNOSIS — E039 Hypothyroidism, unspecified: Secondary | ICD-10-CM

## 2022-04-19 NOTE — Telephone Encounter (Signed)
Pharmacy comment: euthyrox is no longer available. do you want to swith to levothyroxine or synthroid? please advise.

## 2022-04-19 NOTE — Telephone Encounter (Signed)
Please advise 

## 2022-04-24 ENCOUNTER — Telehealth (INDEPENDENT_AMBULATORY_CARE_PROVIDER_SITE_OTHER): Payer: No Typology Code available for payment source | Admitting: Physician Assistant

## 2022-04-24 ENCOUNTER — Encounter: Payer: Self-pay | Admitting: Physician Assistant

## 2022-04-24 VITALS — HR 120 | Ht 63.0 in | Wt 182.0 lb

## 2022-04-24 DIAGNOSIS — R000 Tachycardia, unspecified: Secondary | ICD-10-CM | POA: Insufficient documentation

## 2022-04-24 DIAGNOSIS — E039 Hypothyroidism, unspecified: Secondary | ICD-10-CM

## 2022-04-24 DIAGNOSIS — F419 Anxiety disorder, unspecified: Secondary | ICD-10-CM | POA: Diagnosis not present

## 2022-04-24 NOTE — Progress Notes (Signed)
..  Virtual Visit via Video Note  I connected with Joan Mcgee on 04/24/22 at 11:30 AM EDT by a video enabled telemedicine application and verified that I am speaking with the correct person using two identifiers.  Location: Patient: home Provider: clinic  .Marland KitchenParticipating in visit:  Patient: Joan Mcgee Provider: Tandy Gaw PA-C   I discussed the limitations of evaluation and management by telemedicine and the availability of in person appointments. The patient expressed understanding and agreed to proceed.  History of Present Illness: Pt is a 41 yo female with hypothyroidism, ADD, anxiety, and depression who is concerned about increased heart rate and anxiousness for the last few days. Yesterday was really bad with HR as high as 120. No CP, headache, vision changes. She has been on the same medications since June with none of these symptoms but wonders if it could be the wellbutrin. She is taking her thyroid medication and TSH was low at last check.   .. 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    Assessment and Plan: Marland KitchenDiagnoses and all orders for this visit:  Tachycardia -     TSH + free T4 -     T3, free  Anxiety -     TSH + free T4 -     T3, free  Hypothyroidism, unspecified type -     TSH + free T4 -     T3, free   Last TSH was low and no medication changes were made.  Her symptoms sound like could be due to hyperthyroid state. Will check her labs and adjust medication accordingly.  Avoid caffeine and exercise until you get labs.  I do not think these symptoms are due to metformin or wellbutrin since you had been on them months before.    Follow Up Instructions:    I discussed the assessment and treatment plan with the patient. The patient was provided an opportunity to ask questions and all were answered. The patient agreed with the plan and demonstrated an understanding of the instructions.   The patient was advised to call back or seek an in-person evaluation if the symptoms worsen or if the condition fails to improve as anticipated.  Marland Kitchen, PA-C

## 2022-04-24 NOTE — Progress Notes (Signed)
Pt would like a medication change or dose change  on Wellbutrin

## 2022-04-26 ENCOUNTER — Other Ambulatory Visit: Payer: Self-pay | Admitting: Physician Assistant

## 2022-04-26 LAB — TSH+FREE T4: TSH W/REFLEX TO FT4: 0.11 mIU/L — ABNORMAL LOW

## 2022-04-26 LAB — T3, FREE: T3, Free: 3.1 pg/mL (ref 2.3–4.2)

## 2022-04-26 LAB — T4, FREE: Free T4: 1.5 ng/dL (ref 0.8–1.8)

## 2022-04-26 MED ORDER — LEVOTHYROXINE SODIUM 112 MCG PO TABS: 112.0000 ug | ORAL_TABLET | Freq: Every day | ORAL | 1 refills | Status: DC

## 2022-04-26 NOTE — Progress Notes (Signed)
TSH showing HYPER thyroid state. Hold levothyroxine dose for the next day and restart at daily. Recheck labs in 4 weeks.

## 2022-04-29 ENCOUNTER — Other Ambulatory Visit: Payer: Self-pay | Admitting: Neurology

## 2022-04-29 DIAGNOSIS — E039 Hypothyroidism, unspecified: Secondary | ICD-10-CM

## 2022-05-15 ENCOUNTER — Other Ambulatory Visit: Payer: Self-pay | Admitting: Physician Assistant

## 2022-05-15 MED ORDER — METFORMIN HCL ER (MOD) 1000 MG PO TB24: 1000.0000 mg | ORAL_TABLET | Freq: Two times a day (BID) | ORAL | 1 refills | Status: DC

## 2022-05-16 NOTE — Addendum Note (Signed)
Addended by: Stan Head on: 05/16/2022 10:48 AM   Modules accepted: Orders

## 2022-05-16 NOTE — Telephone Encounter (Signed)
Spoke with pharmacist who states that the RX was sent in as [place], which is not covered under insurance and would cost the pt $21,000 for a 90 day supply.  Pharmacist recommended that Metformin ER 500mg  be prescribed at the appropriate dose.  RX pended.  , CMA

## 2022-05-17 MED ORDER — METFORMIN HCL ER 500 MG PO TB24: 1000.0000 mg | ORAL_TABLET | Freq: Two times a day (BID) | ORAL | 3 refills | Status: DC

## 2022-05-17 NOTE — Telephone Encounter (Signed)
Patient and pharmacy also called and left vm's regarding this.

## 2022-05-17 NOTE — Addendum Note (Signed)
Addended by: Jomarie Longs on: 05/17/2022 12:21 PM   Modules accepted: Orders

## 2022-05-28 ENCOUNTER — Ambulatory Visit: Payer: No Typology Code available for payment source | Admitting: Family Medicine

## 2022-06-01 LAB — TSH: TSH: 0.65 mIU/L

## 2022-06-03 ENCOUNTER — Other Ambulatory Visit: Payer: Self-pay | Admitting: Physician Assistant

## 2022-06-03 DIAGNOSIS — E039 Hypothyroidism, unspecified: Secondary | ICD-10-CM

## 2022-06-03 MED ORDER — LEVOTHYROXINE SODIUM 112 MCG PO TABS: 112.0000 ug | ORAL_TABLET | Freq: Every day | ORAL | 1 refills | Status: DC

## 2022-06-03 MED ORDER — LIOTHYRONINE SODIUM 5 MCG PO TABS: 5.0000 ug | ORAL_TABLET | Freq: Every day | ORAL | 1 refills | Status: AC

## 2022-06-03 NOTE — Progress Notes (Signed)
In normal range! Sent refills for 6 months.

## 2022-06-21 ENCOUNTER — Ambulatory Visit: Payer: No Typology Code available for payment source | Admitting: Family Medicine

## 2022-06-26 ENCOUNTER — Ambulatory Visit: Payer: No Typology Code available for payment source | Admitting: Family Medicine

## 2022-07-12 ENCOUNTER — Encounter: Payer: Self-pay | Admitting: Physician Assistant

## 2022-07-19 ENCOUNTER — Encounter: Payer: No Typology Code available for payment source | Admitting: Adult Health

## 2022-07-23 ENCOUNTER — Encounter: Payer: Self-pay | Admitting: Family Medicine

## 2022-07-23 ENCOUNTER — Ambulatory Visit (INDEPENDENT_AMBULATORY_CARE_PROVIDER_SITE_OTHER): Payer: No Typology Code available for payment source | Admitting: Family Medicine

## 2022-07-23 VITALS — BP 130/86 | HR 89 | Ht 63.0 in | Wt 192.1 lb

## 2022-07-23 DIAGNOSIS — R7301 Impaired fasting glucose: Secondary | ICD-10-CM

## 2022-07-23 DIAGNOSIS — Z114 Encounter for screening for human immunodeficiency virus [HIV]: Secondary | ICD-10-CM

## 2022-07-23 DIAGNOSIS — F32A Depression, unspecified: Secondary | ICD-10-CM

## 2022-07-23 DIAGNOSIS — Z1159 Encounter for screening for other viral diseases: Secondary | ICD-10-CM | POA: Diagnosis not present

## 2022-07-23 DIAGNOSIS — E038 Other specified hypothyroidism: Secondary | ICD-10-CM

## 2022-07-23 DIAGNOSIS — E7849 Other hyperlipidemia: Secondary | ICD-10-CM

## 2022-07-23 DIAGNOSIS — F419 Anxiety disorder, unspecified: Secondary | ICD-10-CM

## 2022-07-23 DIAGNOSIS — E559 Vitamin D deficiency, unspecified: Secondary | ICD-10-CM | POA: Diagnosis not present

## 2022-07-23 MED ORDER — BUPROPION HCL ER (XL) 150 MG PO TB24: 150.0000 mg | ORAL_TABLET | Freq: Every day | ORAL | 1 refills | Status: DC

## 2022-07-23 NOTE — Patient Instructions (Addendum)
I appreciate the opportunity to provide care to you today!    Follow up:  1 months  Labs: please stop by the lab during to get your blood drawn (CBC, CMP, TSH, Lipid profile, HgA1c, Vit D)  Screening: HIV and Hep C   Please pick up your medication at the pharmacy     Please continue to a heart-healthy diet and increase your physical activities. Try to exercise for at least three times a week.      It was a pleasure to see you and I look forward to continuing to work together on your health and well-being. Please do not hesitate to call the office if you need care or have questions about your care.   Have a wonderful day and week. With Gratitude, Gilmore Laroche MSN, FNP-BC

## 2022-07-23 NOTE — Progress Notes (Signed)
New Patient Office Visit  Subjective:  Patient ID: Joan Mcgee, female    DOB: Jan 15, 1981  Age: 41 y.o. MRN: [id number]  CC:  Chief Complaint  Patient presents with   Establish Care    New patient, pt would like to discuss change of medication for Wellbutrin, states dose she is on makes her feel more depressed and more anxiety than before.     HPI Joan Mcgee is a 41 y.o. female with past medical history of hypothyroidism, anxiety and depression, and ADHD presents for establishing care.  Anxiety and depression: She takes Wellbutrin 300 mg daily and reports minimal relief of her symptoms, noting to have increased anxiety.  Her dose was increased to 300 mg in June 2023.  She reports feeling much better on Wellbutrin 150 mg daily. She denies suicidal thoughts and ideation.  Past Medical History:  Diagnosis Date   Congenital heart defect    Herpes    History of blood clots 11/05/2016   Had superficial clots in legs when pregnant,had veins done with laser in past, and has appt with [facility] 3/15   History of herpes simplex infection 06/27/2014   Hypothyroid 11/19/2016   Vaginal itching 06/27/2014   Varicose veins     Past Surgical History:  Procedure Laterality Date   ASD REPAIR N/A    CARDIAC SURGERY     as infant and child   veins      Family History  Problem Relation Age of Onset   Diabetes Mother    Heart attack Mother        x 2   Other Father        has a pacemaker   Endometriosis Sister    Cancer Paternal Grandmother        colon    Social History   Socioeconomic History   Marital status: Married    Spouse name: Not on file   Number of children: Not on file   Years of education: Not on file   Highest education level: Not on file  Occupational History   Not on file  Tobacco Use   Smoking status: Never   Smokeless tobacco: Never  Vaping Use   Vaping Use: Never used  Substance and Sexual Activity   Alcohol use: No   Drug use: No   Sexual  activity: Yes    Birth control/protection: Surgical    Comment: vasectomy  Other Topics Concern   Not on file  Social History Narrative   Not on file   Social Determinants of Health   Financial Resource Strain: Not on file  Food Insecurity: Not on file  Transportation Needs: Not on file  Physical Activity: Not on file  Stress: Not on file  Social Connections: Not on file  Intimate Partner Violence: Not on file    ROS Review of Systems  Constitutional:  Negative for chills and fatigue.  HENT:  Negative for sinus pressure and sinus pain.   Eyes:  Negative for visual disturbance.  Respiratory:  Negative for chest tightness and shortness of breath.   Cardiovascular:  Negative for chest pain and palpitations.  Gastrointestinal:  Negative for nausea and vomiting.  Endocrine: Negative for polyphagia and polyuria.  Genitourinary:  Negative for vaginal bleeding.  Musculoskeletal:  Negative for back pain and gait problem.  Neurological:  Negative for dizziness, facial asymmetry and light-headedness.  Psychiatric/Behavioral:  Negative for self-injury and suicidal ideas.     Objective:   Today's Vitals: BP 130/86  Pulse 89   Ht _0  (1.6 m)   Wt 192 lb 1.3 oz (87.1 kg)   SpO2 98%   BMI 34.03 kg/m   Physical Exam HENT:     Head: Normocephalic.     Right Ear: External ear normal.     Left Ear: External ear normal.     Nose: No congestion.     Mouth/Throat:     Mouth: Mucous membranes are moist.  Cardiovascular:     Rate and Rhythm: Normal rate and regular rhythm.     Pulses: Normal pulses.     Heart sounds: Normal heart sounds.  Pulmonary:     Effort: Pulmonary effort is normal.     Breath sounds: Normal breath sounds. No wheezing.  Skin:    Findings: No bruising.  Neurological:     Mental Status: She is alert.      Assessment & Plan:   Anxiety and depression Assessment & Plan: We will discontinue Wellbutrin 300 mg today We will start patient on Wellbutrin   150 mg daily Encouraged to follow-up in 1 month to assess treatment regiment We will get basic labs today including one-time HIV and hep C screening  Orders: -     buPROPion HCl ER (XL); Take 1 tablet (150 mg total) by mouth daily.  Dispense: 30 tablet; Refill: 1  IFG (impaired fasting glucose) -     Hemoglobin A1c  Vitamin D deficiency -     VITAMIN D 25 Hydroxy (Vit-D Deficiency, Fractures)  Need for hepatitis C screening test -     Hepatitis C antibody  Encounter for screening for HIV -     HIV Antibody (routine testing w rflx)  Other specified hypothyroidism -     TSH + free T4  Other hyperlipidemia -     Lipid panel -     CMP14+EGFR -     CBC with Differential/Platelet     Follow-up: Return in about 1 month (around 08/22/2022) for anxiety and depression.   Alvira Monday, FNP

## 2022-07-23 NOTE — Assessment & Plan Note (Signed)
We will discontinue Wellbutrin 300 mg today We will start patient on Wellbutrin  150 mg daily Encouraged to follow-up in 1 month to assess treatment regiment We will get basic labs today including one-time HIV and hep C screening

## 2022-08-21 ENCOUNTER — Ambulatory Visit: Payer: No Typology Code available for payment source | Admitting: Family Medicine

## 2022-08-23 ENCOUNTER — Telehealth: Payer: No Typology Code available for payment source | Admitting: Family Medicine

## 2022-08-23 DIAGNOSIS — J111 Influenza due to unidentified influenza virus with other respiratory manifestations: Secondary | ICD-10-CM

## 2022-08-23 MED ORDER — OSELTAMIVIR PHOSPHATE 75 MG PO CAPS: 75.0000 mg | ORAL_CAPSULE | Freq: Two times a day (BID) | ORAL | 0 refills | Status: AC

## 2022-08-23 NOTE — Patient Instructions (Signed)

## 2022-08-23 NOTE — Progress Notes (Signed)
Virtual Visit Consent   Joan Mcgee, you are scheduled for a virtual visit with a [facility] provider today. Just as with appointments in the office, your consent must be obtained to participate. Your consent will be active for this visit and any virtual visit you may have with one of our providers in the next 365 days. If you have a MyChart account, a copy of this consent can be sent to you electronically.  As this is a virtual visit, video technology does not allow for your provider to perform a traditional examination. This may limit your provider's ability to fully assess your condition. If your provider identifies any concerns that need to be evaluated in person or the need to arrange testing (such as labs, EKG, etc.), we will make arrangements to do so. Although advances in technology are sophisticated, we cannot ensure that it will always work on either your end or our end. If the connection with a video visit is poor, the visit may have to be switched to a telephone visit. With either a video or telephone visit, we are not always able to ensure that we have a secure connection.  By engaging in this virtual visit, you consent to the provision of healthcare and authorize for your insurance to be billed (if applicable) for the services provided during this visit. Depending on your insurance coverage, you may receive a charge related to this service.  I need to obtain your verbal consent now. Are you willing to proceed with your visit today? Joan Mcgee has provided verbal consent on 08/23/2022 for a virtual visit (video or telephone). Joan Curio, FNP  Date: 08/23/2022 5:47 PM  Virtual Visit via Video Note   I, Joan Mcgee, connected with  Joan Mcgee  ([id number], 04/02/1981) on 08/23/22 at  5:45 PM EST by a video-enabled telemedicine application and verified that I am speaking with the correct person using two identifiers.  Location: Patient: Virtual Visit Location Patient: Home Provider:  Virtual Visit Location Provider: Home Office   I discussed the limitations of evaluation and management by telemedicine and the availability of in person appointments. The patient expressed understanding and agreed to proceed.    History of Present Illness: Joan Mcgee is a 41 y.o. who identifies as a female who was assigned female at birth, and is being seen today for exposure to influenza with fever chills, runny nose and cough since Wednesday evening. Exposed at cheer event. Also popped a pimple on face yesterday but its still red. No drainage. No eye pain - it is under left eye. 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   Hypothyroidism 11/19/2016   History of blood clots 11/05/2016   Acute mastitis of right breast 08/19/2014   Vaginal itching 06/27/2014   History of herpes simplex infection 06/27/2014    Allergies:  Allergies  Allergen Reactions   Adderall [Amphetamine-Dextroamphetamine]     headaches   Medications:  Current Outpatient Medications:    oseltamivir (TAMIFLU) 75 MG capsule, Take 1 capsule (75 mg total) by mouth 2 (two) times daily for 5 days., Disp: 10 capsule, Rfl: 0   buPROPion (  WELLBUTRIN XL) 150 MG 24 hr tablet, Take 1 tablet (150 mg total) by mouth daily., Disp: 30 tablet, Rfl: 1   levothyroxine (SYNTHROID) 112 MCG tablet, Take 1 tablet (112 mcg total) by mouth daily before breakfast., Disp: 90 tablet, Rfl: 1   liothyronine (CYTOMEL) 5 MCG tablet, Take 1 tablet (5 mcg total) by mouth daily., Disp: 90 tablet, Rfl: 1   MAGNESIUM PO, Take by mouth., Disp: , Rfl:    metFORMIN (GLUCOPHAGE-XR) 500 MG 24 hr  tablet, Take 2 tablets (1,000 mg total) by mouth 2 (two) times daily with a meal., Disp: 360 tablet, Rfl: 3   Probiotic Product (PROBIOTIC-10 PO), Take by mouth., Disp: , Rfl:    vitamin B-12 (CYANOCOBALAMIN) 1000 MCG tablet, Take 1,000 mcg by mouth daily., Disp: , Rfl:   Observations/Objective: Patient is well-developed, well-nourished in no acute distress.  Resting comfortably  at home.  Head is normocephalic, atraumatic.  No labored breathing.  Speech is clear and coherent with logical content.  Patient is alert and oriented at baseline.  Redness under eye appears as a pimple with irritation but not infected.   Assessment and Plan: 1. Influenza  Increase fluids, tylenol or ibuprofen as directed, urgent care if sx worsen or appearance of facial are worsens.   Follow Up Instructions: I discussed the assessment and treatment plan with the patient. The patient was provided an opportunity to ask questions and all were answered. The patient agreed with the plan and demonstrated an understanding of the instructions.  A copy of instructions were sent to the patient via MyChart unless otherwise noted below.     The patient was advised to call back or seek an in-person evaluation if the symptoms worsen or if the condition fails to improve as anticipated.  Time:  I spent 10 minutes with the patient via telehealth technology discussing the above problems/concerns.    Dellia Nims, FNP

## 2022-08-25 ENCOUNTER — Telehealth: Payer: No Typology Code available for payment source | Admitting: Family Medicine

## 2022-08-25 DIAGNOSIS — R21 Rash and other nonspecific skin eruption: Secondary | ICD-10-CM

## 2022-08-25 NOTE — Patient Instructions (Signed)
Rash, Adult A rash is a change in the color of your skin. A rash can also change the way your skin feels. There are many different conditions and factors that can cause a rash. Some rashes may disappear after a few days, but some may last for a few weeks. Common causes of rashes include: Viral infections, such as: Colds. Measles. Hand, foot, and mouth disease. Bacterial infections, such as: Scarlet fever. Impetigo. Fungal infections, such as Candida. Allergic reactions to food, medicines, or skin care products. Follow these instructions at home: The goal of treatment is to stop the itching and keep the rash from spreading. Pay attention to any changes in your symptoms. Follow these instructions to help with your condition: Medicine Take or apply over-the-counter and prescription medicines only as told by your health care provider. These may include: Corticosteroid creams to treat red or swollen skin. Anti-itch lotions. Oral allergy medicines (antihistamines). Oral corticosteroids for severe symptoms.  Skin care Apply cool compresses to the affected areas. Do not scratch or rub your skin. Avoid covering the rash. Make sure the rash is exposed to air as much as possible. Managing itching and discomfort Avoid hot showers or baths, which can make itching worse. A cold shower may help. Try taking a bath with: Epsom salts. Follow manufacturer instructions on the packaging. You can get these at your local pharmacy or grocery store. Baking soda. Pour a small amount into the bath as told by your health care provider. Colloidal oatmeal. Follow manufacturer instructions on the packaging. You can get this at your local pharmacy or grocery store. Try applying baking soda paste to your skin. Stir water into baking soda until it reaches a paste-like consistency. Try applying calamine lotion. This is an over-the-counter lotion that helps to relieve itchiness. Keep cool and out of the sun. Sweating  and being hot can make itching worse. General instructions  Rest as needed. Drink enough fluid to keep your urine pale yellow. Wear loose-fitting clothing. Avoid scented soaps, detergents, and perfumes. Use gentle soaps, detergents, perfumes, and other cosmetic products. Avoid any substance that causes your rash. Keep a journal to help track what causes your rash. Write down: What you eat. What cosmetic products you use. What you drink. What you wear. This includes jewelry. Keep all follow-up visits as told by your health care provider. This is important. Contact a health care provider if: You sweat at night. You lose weight. You urinate more than normal. You urinate less than normal, or you notice that your urine is a darker color than usual. You feel weak. You vomit. Your skin or the whites of your eyes look yellow (jaundice). Your skin: Tingles. Is numb. Your rash: Does not go away after several days. Gets worse. You are: Unusually thirsty. More tired than normal. You have: New symptoms. Pain in your abdomen. A fever. Diarrhea. Get help right away if you: Have a fever and your symptoms suddenly get worse. Develop confusion. Have a severe headache or a stiff neck. Have severe joint pains or stiffness. Have a seizure. Develop a rash that covers all or most of your body. The rash may or may not be painful. Develop blisters that: Are on top of the rash. Grow larger or grow together. Are painful. Are inside your nose or mouth. Develop a rash that: Looks like purple pinprick-sized spots all over your body. Has a "bull's eye" or looks like a target. Is not related to sun exposure, is red and painful, and causes   your skin to peel. Summary A rash is a change in the color of your skin. Some rashes disappear after a few days, but some may last for a few weeks. The goal of treatment is to stop the itching and keep the rash from spreading. Take or apply over-the-counter  and prescription medicines only as told by your health care provider. Contact a health care provider if you have new or worsening symptoms. Keep all follow-up visits as told by your health care provider. This is important. This information is not intended to replace advice given to you by your health care provider. Make sure you discuss any questions you have with your health care provider. Document Revised: 02/19/2022 Document Reviewed: 05/31/2021 Elsevier Patient Education  2023 Elsevier Inc.  

## 2022-08-25 NOTE — Progress Notes (Signed)
Virtual Visit Consent   Joan Mcgee, you are scheduled for a virtual visit with a [facility] provider today. Just as with appointments in the office, your consent must be obtained to participate. Your consent will be active for this visit and any virtual visit you may have with one of our providers in the next 365 days. If you have a MyChart account, a copy of this consent can be sent to you electronically.  As this is a virtual visit, video technology does not allow for your provider to perform a traditional examination. This may limit your provider's ability to fully assess your condition. If your provider identifies any concerns that need to be evaluated in person or the need to arrange testing (such as labs, EKG, etc.), we will make arrangements to do so. Although advances in technology are sophisticated, we cannot ensure that it will always work on either your end or our end. If the connection with a video visit is poor, the visit may have to be switched to a telephone visit. With either a video or telephone visit, we are not always able to ensure that we have a secure connection.  By engaging in this virtual visit, you consent to the provision of healthcare and authorize for your insurance to be billed (if applicable) for the services provided during this visit. Depending on your insurance coverage, you may receive a charge related to this service.  I need to obtain your verbal consent now. Are you willing to proceed with your visit today? [name] has provided verbal consent on 08/25/2022 for a virtual visit (video or telephone). Georgana Curio, FNP  Date: 08/25/2022 1:22 PM  Virtual Visit via Video Note   I, Georgana Curio, connected with  Joan Mcgee  ([id number], 10-15-1980) on 08/25/22 at  1:15 PM EST by a video-enabled telemedicine application and verified that I am speaking with the correct person using two identifiers.  Location: Patient: Virtual Visit Location Patient: Home Provider:  Virtual Visit Location Provider: Home   I discussed the limitations of evaluation and management by telemedicine and the availability of in person appointments. The patient expressed understanding and agreed to proceed.    History of Present Illness: Joan Mcgee is a 41 y.o. who identifies as a female who was assigned female at birth, and is being seen today for rash on front of neck and face. It does not itch. She has recently had a viral illness. No further fever. Other sx are improving. Pimple of face is healing. 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   Hypothyroidism 11/19/2016   History of blood clots 11/05/2016   Acute mastitis of right breast 08/19/2014   Vaginal itching 06/27/2014   History of herpes simplex infection 06/27/2014    Allergies:  Allergies  Allergen Reactions   Adderall [Amphetamine-Dextroamphetamine]     headaches   Medications:  Current Outpatient Medications:    buPROPion (WELLBUTRIN XL) 150 MG 24 hr tablet, Take 1 tablet (150 mg total) by mouth daily., Disp: 30 tablet, Rfl: 1   levothyroxine (SYNTHROID) 112 MCG tablet, Take 1 tablet (112 mcg total) by mouth daily  before breakfast., Disp: 90 tablet, Rfl: 1   liothyronine (CYTOMEL) 5 MCG tablet, Take 1 tablet (5 mcg total) by mouth daily., Disp: 90 tablet, Rfl: 1   MAGNESIUM PO, Take by mouth., Disp: , Rfl:    metFORMIN (GLUCOPHAGE-XR) 500 MG 24 hr tablet, Take 2 tablets (1,000 mg total) by mouth 2 (two) times daily with a meal., Disp: 360 tablet, Rfl: 3   oseltamivir (TAMIFLU) 75 MG capsule, Take 1 capsule (75 mg total) by mouth 2 (two) times daily  for 5 days., Disp: 10 capsule, Rfl: 0   Probiotic Product (PROBIOTIC-10 PO), Take by mouth., Disp: , Rfl:    vitamin B-12 (CYANOCOBALAMIN) 1000 MCG tablet, Take 1,000 mcg by mouth daily., Disp: , Rfl:   Observations/Objective: Patient is well-developed, well-nourished in no acute distress.  Resting comfortably  at home.  Head is normocephalic, atraumatic.  No labored breathing.  Speech is clear and coherent with logical content.  Patient is alert and oriented at baseline.  Rash fineon neck and chest.   Assessment and Plan: 1. Rash  Hydrocortisone otc, urgent care if sx persist or worsen.   Follow Up Instructions: I discussed the assessment and treatment plan with the patient. The patient was provided an opportunity to ask questions and all were answered. The patient agreed with the plan and demonstrated an understanding of the instructions.  A copy of instructions were sent to the patient via MyChart unless otherwise noted below.     The patient was advised to call back or seek an in-person evaluation if the symptoms worsen or if the condition fails to improve as anticipated.  Time:  I spent 10 minutes with the patient via telehealth technology discussing the above problems/concerns.    Georgana Curio, FNP

## 2022-09-03 ENCOUNTER — Other Ambulatory Visit (HOSPITAL_COMMUNITY): Payer: Self-pay | Admitting: Adult Health Nurse Practitioner

## 2022-09-03 DIAGNOSIS — Z1231 Encounter for screening mammogram for malignant neoplasm of breast: Secondary | ICD-10-CM

## 2022-09-04 ENCOUNTER — Ambulatory Visit: Payer: Self-pay | Admitting: Adult Health

## 2022-09-05 ENCOUNTER — Encounter: Payer: No Typology Code available for payment source | Admitting: Adult Health

## 2022-09-24 DIAGNOSIS — Z13220 Encounter for screening for lipoid disorders: Secondary | ICD-10-CM | POA: Diagnosis not present

## 2022-09-24 DIAGNOSIS — E559 Vitamin D deficiency, unspecified: Secondary | ICD-10-CM | POA: Diagnosis not present

## 2022-09-24 DIAGNOSIS — T148XXA Other injury of unspecified body region, initial encounter: Secondary | ICD-10-CM | POA: Diagnosis not present

## 2022-09-24 DIAGNOSIS — E039 Hypothyroidism, unspecified: Secondary | ICD-10-CM | POA: Diagnosis not present

## 2022-09-24 DIAGNOSIS — Z124 Encounter for screening for malignant neoplasm of cervix: Secondary | ICD-10-CM | POA: Diagnosis not present

## 2022-09-24 DIAGNOSIS — Z0000 Encounter for general adult medical examination without abnormal findings: Secondary | ICD-10-CM | POA: Diagnosis not present

## 2022-09-24 DIAGNOSIS — E88819 Insulin resistance, unspecified: Secondary | ICD-10-CM | POA: Diagnosis not present

## 2022-09-24 DIAGNOSIS — E538 Deficiency of other specified B group vitamins: Secondary | ICD-10-CM | POA: Diagnosis not present

## 2022-09-25 ENCOUNTER — Ambulatory Visit (INDEPENDENT_AMBULATORY_CARE_PROVIDER_SITE_OTHER): Payer: Commercial Managed Care - PPO | Admitting: Allergy & Immunology

## 2022-09-25 ENCOUNTER — Encounter: Payer: Self-pay | Admitting: Allergy & Immunology

## 2022-09-25 VITALS — BP 102/70 | HR 102 | Temp 99.4°F | Resp 16 | Ht 63.0 in | Wt 193.2 lb

## 2022-09-25 DIAGNOSIS — E039 Hypothyroidism, unspecified: Secondary | ICD-10-CM

## 2022-09-25 DIAGNOSIS — R21 Rash and other nonspecific skin eruption: Secondary | ICD-10-CM

## 2022-09-25 MED ORDER — HYDROCORTISONE 2.5 % EX OINT: TOPICAL_OINTMENT | Freq: Two times a day (BID) | CUTANEOUS | 1 refills | Status: DC

## 2022-09-25 NOTE — Progress Notes (Signed)
NEW PATIENT  Date of Service/Encounter:  09/25/22  Consult requested by: Pablo Lawrence, NP   Assessment:   Rash  Hypothyroidism - getting dosage adjusted  Plan/Recommendations:    1. Rash - with hypothyroidism - Start hydrocortisone ointment twice daily to the rash to see if this helps. - Start cetirizine 10mg  at night. - Once you start your new thyroid dosage, continue for two weeks and then wean off of the cetirizine.  - We can certainly do testing at the next visit if needed, but with your lack of allergic history, I am not convinced that testing is going to be useful for you today.  - This does not sound like it has anything to do with wheat, either.  - This type of rash would be very pruritic (itchy) and yours is not.  - I do not think that any labs would be helpful since Courtney did all of the stuff I would normally do anyway.   2. Return in about 6 weeks (around 11/06/2022).    This note in its entirety was forwarded to the Provider who requested this consultation.  Subjective:   Joan Mcgee is a 42 y.o. female presenting today for evaluation of  Chief Complaint  Patient presents with   Rash    Has been having a splotchy rash all over the body.      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  Marcheta Grammes was referred by Pablo Lawrence,  NP.     Joan Mcgee is a 42 y.o. female presenting for an evaluation of a rash.    Right before Christmas, she got sick with a sinus congestion and low grade fever. She was around someone with influenza. Symptoms did not get worse. She started taking an OTC cold and flu medication. She took it for two days and then it broke out over her upper back. She had testing for COVID which was negative. It has generally gotten better since it first started. She took the cold medication on December 22nd and 23rd and she developed rash on the 24th.   She had a slew of medications that showed a low TSH and an elevated free T4. She is getting her levothyroxine lowered. She has had thyroid issues since 2017.   She has not taken anything for this rash at all. She is now cold all of the time.  The rash is not pruritic. She has not put anything on it at all. She has not even tried OTC hydrocortisone. She has not seen Dermatology for these issues.    She otherwise has no atopic history at all.   Otherwise, there is no history of other atopic diseases, including asthma, food allergies, drug allergies, environmental allergies, stinging insect allergies, or contact dermatitis. There is no significant infectious history. Vaccinations are up to date.    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 If you have any questions, ask your nurse or doctor.          buPROPion 150 MG 24 hr tablet Commonly known as: Wellbutrin XL Take 1 tablet (150 mg total) by mouth daily.   cyanocobalamin 1000 MCG tablet Commonly known as: VITAMIN B12 Take 1,000 mcg by mouth daily.   hydrocortisone 2.5 % ointment Apply topically 2 (two) times daily. Started by: Valentina Shaggy, MD   ibuprofen 800 MG tablet Commonly known as: ADVIL Take by mouth.   levothyroxine 112 MCG tablet Commonly known as: SYNTHROID Take 1 tablet (112 mcg total) by mouth daily before breakfast.   liothyronine 5 MCG tablet Commonly known as: CYTOMEL Take 1 tablet (5 mcg total) by mouth daily.   MAGNESIUM PO Take by mouth.   metFORMIN 500 MG 24 hr tablet Commonly known as: GLUCOPHAGE-XR Take 2 tablets (1,000 mg total) by mouth 2 (two) times daily with a meal.   PROBIOTIC-10 PO Take by mouth.        Birth History: non-contributory  Developmental History: non-contributory  Past Surgical History: Past Surgical History:  Procedure Laterality Date   ASD REPAIR N/A    CARDIAC SURGERY     as infant and child   veins       Family History: Family History  Problem Relation Age of Onset   Diabetes Mother    Heart attack Mother        x 2   Other Father        has a pacemaker   Endometriosis Sister    Cancer Paternal Grandmother        colon   Eczema Son    Allergic rhinitis Neg Hx    Angioedema Neg Hx    Asthma Neg Hx      Social History: Shanley lives at home with her family. She lives in a house that was built in 1968. There is laminate in the main living areas and carpeting in the bedrooms. There is gas heating and central cooling. There are dogs and cats inside of the home. There are cats outside of the home. There are no dust mite coverings on the bedding. There is  no tobacco exposure in the home. 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 There are no fume, chemical, or dust exposures. There is a HEPA filter in the home. They do not live near an interstate or industrial area.    Review of Systems  Constitutional: Negative.  Negative for chills, fever, malaise/fatigue and weight loss.  HENT: Negative.  Negative for congestion, ear discharge and ear pain.   Eyes:  Negative for pain,  discharge and redness.  Respiratory:  Negative for cough, sputum production, shortness of breath and wheezing.   Cardiovascular: Negative.  Negative for chest pain and palpitations.  Gastrointestinal:  Negative for abdominal pain, heartburn, nausea and vomiting.  Skin:  Positive for itching and rash.  Neurological:  Negative for dizziness and headaches.  Endo/Heme/Allergies:  Negative for environmental allergies. Does not bruise/bleed easily.       Objective:   Blood pressure 102/70, pulse (!) 102, temperature 99.4 F (37.4 C), resp. rate 16, height 5\' 3"  (1.6 m), weight 193 lb 4 oz (87.7 kg), SpO2 96 %. Body mass index is 34.23 kg/m.     Physical Exam Constitutional:      Appearance: She is well-developed.  HENT:     Head: Normocephalic and atraumatic.     Right Ear: Tympanic membrane, ear canal and external ear normal. No drainage, swelling or tenderness. Tympanic membrane is not injected, scarred, erythematous, retracted or bulging.     Left Ear: Tympanic membrane, ear canal and external ear normal. No drainage, swelling or tenderness. Tympanic membrane is not injected, scarred, erythematous, retracted or bulging.     Nose: No nasal deformity, septal deviation, mucosal edema or rhinorrhea.     Right Sinus: No maxillary sinus tenderness or frontal sinus tenderness.     Left Sinus: No maxillary sinus tenderness or frontal sinus tenderness.     Mouth/Throat:     Mouth: Mucous membranes are not pale and not dry.     Pharynx: Uvula  midline.  Eyes:     General:        Right eye: No discharge.        Left eye: No discharge.     Conjunctiva/sclera: Conjunctivae normal.     Right eye: Right conjunctiva is not injected. No chemosis.    Left eye: Left conjunctiva is not injected. No chemosis.    Pupils: Pupils are equal, round, and reactive to light.  Cardiovascular:     Rate and Rhythm: Normal rate and regular rhythm.     Heart sounds: Normal heart sounds.  Pulmonary:     Effort: Pulmonary effort is normal. No tachypnea, accessory muscle usage or respiratory distress.     Breath sounds: Normal breath sounds. No wheezing, rhonchi or rales.  Chest:     Chest wall: No tenderness.  Abdominal:     Tenderness: There is no abdominal tenderness. There is no guarding or rebound.  Lymphadenopathy:     Head:     Right side of head: No submandibular, tonsillar or occipital adenopathy.     Left side of head: No submandibular, tonsillar or occipital adenopathy.     Cervical: No cervical adenopathy.  Skin:    General: Skin is warm.     Capillary Refill: Capillary refill takes less than 2 seconds.     Coloration: Skin is not pale.     Findings: Rash present. No abrasion, erythema or petechiae. Rash is not papular, urticarial or vesicular.     Comments: Papular rash over the upper back and neck without excoriations.   Neurological:     Mental Status: She is alert.      Diagnostic studies: none           , MD [facility]

## 2022-09-25 NOTE — Patient Instructions (Addendum)
1. Rash - with hypothyroidism - Start hydrocortisone ointment twice daily to the rash to see if this helps. - Start cetirizine 10mg  at night. - Once you start your new thyroid dosage, continue for two weeks and then wean off of the cetirizine.  - We can certainly do testing at the next visit if needed, but with your lack of allergic history, I am not convinced that testing is going to be useful for you today.  - This does not sound like it has anything to do with wheat either.  - This type of rash would be very pruritic (itchy) and yours is not.  - I do not think that any labs would be helpful since Courtney did all of the stuff I would normally do anyway.   2. Return in about 6 weeks (around 11/06/2022).    Please inform us of any Emergency Department visits, hospitalizations, or changes in symptoms. Call us before going to the ED for breathing or allergy symptoms since we might be able to fit you in for a sick visit. Feel free to contact us anytime with any questions, problems, or concerns.  It was a pleasure to meet you today!  Websites that have reliable patient information: 1. American Academy of Asthma, Allergy, and Immunology: www.aaaai.org 2. Food Allergy Research and Education (FARE): foodallergy.org 3. Mothers of Asthmatics: http://www.asthmacommunitynetwork.org 4. American College of Allergy, Asthma, and Immunology: www.acaai.org   COVID-19 Vaccine Information can be found at: ShippingScam.co.uk For questions related to vaccine distribution or appointments, please email [email or web address] or call [phone].   We realize that you might be concerned about having an allergic reaction to the COVID19 vaccines. To help with that concern, WE ARE OFFERING THE COVID19 VACCINES IN OUR OFFICE! Ask the front desk for dates!     "Like" [place] on Facebook and Instagram for our latest updates!      A healthy democracy works best  when [employer] participate! Make sure you are registered to vote! If you have moved or changed any of your contact information, you will need to get this updated before voting!  In some cases, you MAY be able to register to vote online: [email or web address]

## 2022-09-26 ENCOUNTER — Ambulatory Visit: Payer: 59 | Admitting: Family Medicine

## 2022-10-01 ENCOUNTER — Encounter: Payer: Self-pay | Admitting: Allergy & Immunology

## 2022-10-07 ENCOUNTER — Ambulatory Visit (HOSPITAL_COMMUNITY): Payer: 59

## 2022-10-09 DIAGNOSIS — L718 Other rosacea: Secondary | ICD-10-CM | POA: Diagnosis not present

## 2022-10-09 DIAGNOSIS — B09 Unspecified viral infection characterized by skin and mucous membrane lesions: Secondary | ICD-10-CM | POA: Diagnosis not present

## 2022-10-09 DIAGNOSIS — L738 Other specified follicular disorders: Secondary | ICD-10-CM | POA: Diagnosis not present

## 2022-10-17 DIAGNOSIS — R21 Rash and other nonspecific skin eruption: Secondary | ICD-10-CM | POA: Diagnosis not present

## 2022-11-22 ENCOUNTER — Ambulatory Visit (HOSPITAL_COMMUNITY)
Admission: RE | Admit: 2022-11-22 | Discharge: 2022-11-22 | Disposition: A | Discharge status: Home / Self Care | Payer: Commercial Managed Care - PPO | Source: Ambulatory Visit | Attending: Adult Health Nurse Practitioner | Admitting: Adult Health Nurse Practitioner

## 2022-11-22 DIAGNOSIS — Z1231 Encounter for screening mammogram for malignant neoplasm of breast: Secondary | ICD-10-CM | POA: Diagnosis not present

## 2023-01-03 DIAGNOSIS — E039 Hypothyroidism, unspecified: Secondary | ICD-10-CM | POA: Diagnosis not present

## 2023-01-23 ENCOUNTER — Ambulatory Visit: Payer: Commercial Managed Care - PPO | Admitting: Family Medicine

## 2023-01-23 ENCOUNTER — Encounter: Payer: Self-pay | Admitting: Family Medicine

## 2023-01-23 VITALS — BP 116/78 | HR 77 | Temp 98.2°F | Ht 61.0 in | Wt 199.1 lb

## 2023-01-23 DIAGNOSIS — E669 Obesity, unspecified: Secondary | ICD-10-CM | POA: Diagnosis not present

## 2023-01-23 DIAGNOSIS — R739 Hyperglycemia, unspecified: Secondary | ICD-10-CM

## 2023-01-23 DIAGNOSIS — I8393 Asymptomatic varicose veins of bilateral lower extremities: Secondary | ICD-10-CM | POA: Diagnosis not present

## 2023-01-23 LAB — POCT GLYCOSYLATED HEMOGLOBIN (HGB A1C): Hemoglobin A1C: 5.5 % (ref 4.0–5.6)

## 2023-01-23 MED ORDER — TOPIRAMATE 50 MG PO TABS
25.0000 mg | ORAL_TABLET | Freq: Every day | ORAL | 1 refills | Status: DC
Start: 2023-01-23 — End: 2023-05-08
  Filled 2023-02-21 – 2023-02-22 (×2): qty 30, 60d supply, fill #0

## 2023-01-23 MED ORDER — PHENTERMINE HCL 15 MG PO CAPS
15.0000 mg | ORAL_CAPSULE | ORAL | 0 refills | Status: DC
Start: 2023-03-21 — End: 2023-05-08

## 2023-01-23 MED ORDER — PHENTERMINE HCL 15 MG PO CAPS
15.0000 mg | ORAL_CAPSULE | ORAL | 0 refills | Status: DC
Start: 2023-02-20 — End: 2023-05-08

## 2023-01-23 MED ORDER — PHENTERMINE HCL 15 MG PO CAPS: 15.0000 mg | ORAL_CAPSULE | ORAL | 0 refills | Status: DC

## 2023-01-23 NOTE — Progress Notes (Signed)
New Patient Office Visit  Subjective    Patient ID: Ettie Picton, female    DOB: 08-23-1981  Age: 42 y.o. MRN: [id number]  CC:  Chief Complaint  Patient presents with   Establish Care    HPI Rori Meinders presents to establish care Pt was previously seen  at [facility]. States that she is reporting BL leg pain that is going down the front of both legs. States that she also has varicose veins which sometimes hurt when she is on her period. However she is not on her period currently. Hurting for the last 1 -2 weeks.   Patient is concerned about her weight. She reports that she has been trying to lose weight for the last year. States that she is walking 4-5 miles per day, working out, trying to watch her calories. States that the most successful weight was 150 pounds. States every time she loses weight she gains it back. Pregnancies have cause most of the weight gain.   Previous PCP had checked her insulin levels and glucose however she was not fasting when the levels were taken. She was placed on metformin for a while but hasn't been on it in a few months. A1C performed in office today and is 5.5.  Current Outpatient Medications  Medication Instructions   cyanocobalamin (VITAMIN B12) 1,000 mcg, Oral, Daily   ibuprofen (ADVIL) 800 MG tablet Oral   levothyroxine (SYNTHROID) 100 mcg, Oral, Daily   liothyronine (CYTOMEL) 5 mcg, Oral, Daily   MAGNESIUM PO Oral   phentermine 15 mg, Oral, BH-each morning   [START ON 03/21/2023] phentermine 15 mg, Oral, BH-each morning   [START ON 02/20/2023] phentermine 15 mg, Oral, BH-each morning   Probiotic Product (PROBIOTIC-10 PO) Oral   topiramate (TOPAMAX) 25 mg, Oral, Daily   valACYclovir (VALTREX) 500 mg, Oral, As needed    Past Medical History:  Diagnosis Date   Congenital heart defect    Depression    Frequent headaches    Herpes    History of blood clots 11/05/2016   Had superficial clots in legs when pregnant,had veins done  with laser in past, and has appt with [place] [facility] 3/15   History of herpes simplex infection 06/27/2014   Hypothyroid 11/19/2016   Vaginal itching 06/27/2014   Varicose veins     Past Surgical History:  Procedure Laterality Date   ASD REPAIR N/A    CARDIAC SURGERY     as infant and child   veins      Family History  Problem Relation Age of Onset   Diabetes Mother    Heart attack Mother        x 2   Other Father        has a pacemaker   Endometriosis Sister    Cancer Paternal Grandmother        colon   Eczema Son    Allergic rhinitis Neg Hx    Angioedema Neg Hx    Asthma Neg Hx     Social History   Socioeconomic History   Marital status: Married    Spouse name: Not on file   Number of children: Not on file   Years of education: Not on file   Highest education level: Not on file  Occupational History   Not on file  Tobacco Use   Smoking status: Never   Smokeless tobacco: Never  Vaping Use   Vaping Use: Never used  Substance and Sexual Activity   Alcohol use:  Yes    Comment: occasionally   Drug use: No   Sexual activity: Yes    Birth control/protection: Surgical    Comment: vasectomy  Other Topics Concern   Not on file  Social History Narrative   Not on file   Social Determinants of Health   Financial Resource Strain: Not on file  Food Insecurity: Not on file  Transportation Needs: Not on file  Physical Activity: Not on file  Stress: Not on file  Social Connections: Not on file  Intimate Partner Violence: Not on file    Review of Systems  All other systems reviewed and are negative.       Objective    BP 116/78 (BP Location: Left Arm, Patient Position: Sitting, Cuff Size: Large)   Pulse 77   Temp 98.2 F (36.8 C) (Oral)   Ht 5\' 1"  (1.549 m)   Wt 199 lb 1.6 oz (90.3 kg)   LMP 12/13/2022 (Exact Date)   SpO2 99%   BMI 37.62 kg/m   Physical Exam Vitals reviewed.  Constitutional:      Appearance: Normal appearance. She is  well-groomed. She is obese.  Eyes:     Conjunctiva/sclera: Conjunctivae normal.  Neck:     Thyroid: No thyromegaly.  Cardiovascular:     Rate and Rhythm: Normal rate and regular rhythm.     Pulses: Normal pulses.     Heart sounds: S1 normal and S2 normal.  Pulmonary:     Effort: Pulmonary effort is normal.     Breath sounds: Normal breath sounds and air entry.  Abdominal:     General: Bowel sounds are normal.  Musculoskeletal:        General: No swelling, tenderness, deformity or signs of injury. Normal range of motion.     Right lower leg: No edema.     Left lower leg: No edema.  Neurological:     Mental Status: She is alert and oriented to person, place, and time. Mental status is at baseline.     Gait: Gait is intact.  Psychiatric:        Mood and Affect: Mood and affect normal.        Speech: Speech normal.        Behavior: Behavior normal.        Judgment: Judgment normal.         Assessment & Plan:  Hyperglycemia -     POCT glycosylated hemoglobin (Hb A1C)  Obesity (BMI 30-39.9) Assessment & Plan: I have had an extensive 30 minute conversation today with the patient about healthy eating habits, exercise, calorie and carb goals for sustainable and successful weight loss. I gave the patient caloric and protein daily intake values as well as described the importance of increasing fiber and water intake. I discussed weight loss medications that could be used in the treatment of this patient. Handouts on low carb eating were given to the patient.    Total calorie intake: 1800 calories per day  Total protein: 90-100 grams per day  Total fiber: 31 grams per day  Total carbs: less than 90 per day.   Will stat phentermine/topiramate for weight loss. I will see back in 3 months for weight check and medication refills  Orders: -     Phentermine HCl; Take 1 capsule (15 mg total) by mouth every morning.  Dispense: 30 capsule; Refill: 0 -     Topiramate; Take 0.5 tablets  (25 mg total) by mouth daily.  Dispense: 30 tablet;  Refill: 1 -     Phentermine HCl; Take 1 capsule (15 mg total) by mouth every morning.  Dispense: 30 capsule; Refill: 0 -     Phentermine HCl; Take 1 capsule (15 mg total) by mouth every morning.  Dispense: 30 capsule; Refill: 0  Asymptomatic varicose veins of both lower extremities Assessment & Plan: Patient's exam is benign, I do not see any signs of inflammation and there is no swelling or pain with palpation. I recommended changing her tennis shoes and using compression stockings while working out. NSAIDS and ice for pain relief as needed     Return in about 3 months (around 04/25/2023) for weight loss.   Karie Georges, MD

## 2023-01-23 NOTE — Assessment & Plan Note (Addendum)
I have had an extensive 30 minute conversation today with the patient about healthy eating habits, exercise, calorie and carb goals for sustainable and successful weight loss. I gave the patient caloric and protein daily intake values as well as described the importance of increasing fiber and water intake. I discussed weight loss medications that could be used in the treatment of this patient. Handouts on low carb eating were given to the patient.    Total calorie intake: 1800 calories per day  Total protein: 90-100 grams per day  Total fiber: 31 grams per day  Total carbs: less than 90 per day.   Will stat phentermine/topiramate for weight loss. I will see back in 3 months for weight check and medication refills

## 2023-01-23 NOTE — Assessment & Plan Note (Signed)
Patient's exam is benign, I do not see any signs of inflammation and there is no swelling or pain with palpation. I recommended changing her tennis shoes and using compression stockings while working out. NSAIDS and ice for pain relief as needed

## 2023-01-23 NOTE — Patient Instructions (Addendum)
Total calorie intake: 1800 calories per day  Total protein: 90-100 grams per day  Total fiber: 31 grams per day  Total carbs: less than 90 per day.

## 2023-01-26 ENCOUNTER — Encounter: Payer: Self-pay | Admitting: Family Medicine

## 2023-02-08 ENCOUNTER — Telehealth: Payer: Commercial Managed Care - PPO | Admitting: Nurse Practitioner

## 2023-02-08 DIAGNOSIS — H6992 Unspecified Eustachian tube disorder, left ear: Secondary | ICD-10-CM | POA: Diagnosis not present

## 2023-02-08 DIAGNOSIS — H9202 Otalgia, left ear: Secondary | ICD-10-CM | POA: Diagnosis not present

## 2023-02-08 MED ORDER — FLUTICASONE PROPIONATE 50 MCG/ACT NA SUSP
2.0000 | Freq: Every day | NASAL | 6 refills | Status: DC
  Filled 2023-02-22: qty 16, 30d supply, fill #0

## 2023-02-08 MED ORDER — AMOXICILLIN-POT CLAVULANATE 875-125 MG PO TABS: 1.0000 | ORAL_TABLET | Freq: Two times a day (BID) | ORAL | 0 refills | Status: DC

## 2023-02-08 MED ORDER — FLUCONAZOLE 150 MG PO TABS: 150.0000 mg | ORAL_TABLET | Freq: Once | ORAL | 0 refills | Status: AC

## 2023-02-08 NOTE — Addendum Note (Signed)
Addended by: Bennie Pierini on: 02/08/2023 04:59 PM   Modules accepted: Orders

## 2023-02-08 NOTE — Addendum Note (Signed)
Addended by: Bennie Pierini on: 02/08/2023 05:16 PM   Modules accepted: Orders

## 2023-02-08 NOTE — Progress Notes (Signed)
E-Visit for Ear Pain - Eustachian Tube Dysfunction   We are sorry that you are not feeling well. Here is how we plan to help!  Based on what you have shared with me it looks like you have Eustachian Tube Dysfunction.  Eustachian Tube Dysfunction is a condition where the tubes that connect your middle ears to your upper throat become blocked. This can lead to discomfort, hearing difficulties and a feeling of fullness in your ear. Eustachian tube dysfunction usually resolves itself in a few days. The usual symptoms include: Hearing problems Tinnitus, or ringing in your ears Clicking or popping sounds A feeling of fullness in your ears Pain that mimics an ear infection Dizziness, vertigo or balance problems A "tickling" sensation in your ears  ?Eustachian tube dysfunction symptoms may get worse in higher altitudes. This is called barotrauma, and it can happen while scuba diving, flying in an airplane or driving in the mountains.   What causes eustachian tube dysfunction? Allergies and infections (like the common cold and the flu) are the most common causes of eustachian tube dysfunction. These conditions can cause inflammation and mucus buildup, leading to blockage. GERD, or chronic acid reflux, can also cause ETD. This is because stomach acid can back up into your throat and result in inflammation. As mentioned above, altitude changes can also cause ETD.   What are some common eustachian tube dysfunction treatments? In most cases, treatment isn't necessary because ETD often resolves on its own. However, you might need treatment if your symptoms linger for more than two weeks.    Eustachian tube dysfunction treatment depends on the cause and the severity of your condition. Treatments may include home remedies, medications or, in severe cases, surgery.     HOME CARE: Sometimes simple home remedies can help with mild cases of eustachian tube dysfunction. To try and clear the blockage, you  can: Chew gum. Yawn. Swallow. Try the Valsalva maneuver (breathing out forcefully while closing your mouth and pinching your nostrils). Use a saline spray to clear out nasal passages.  MEDICATIONS: Over-the-counter medications can help if allergies are causing eustachian tube dysfunction. Try antihistamines (like cetirizine or diphenhydramine) to ease your symptoms. If you have discomfort, pain relievers -- such as acetaminophen or ibuprofen -- can help.  Sometimes intranasal glucocorticosteroids (like Flonase or Nasacort) help.  I have prescribed Fluticasone 50 mcg/spray 2 sprays in each nostril daily for 10-14 days    GET HELP RIGHT AWAY IF: Fever is over 102.2 degrees. You develop progressive ear pain or hearing loss. Ear symptoms persist longer than 3 days after treatment.  MAKE SURE YOU: Understand these instructions. Will watch your condition. Will get help right away if you are not doing well or get worse.  Thank you for choosing an e-visit.  Your e-visit answers were reviewed by a board certified advanced clinical practitioner to complete your personal care plan. Depending upon the condition, your plan could have included both over the counter or prescription medications.  Please review your pharmacy choice. Make sure the pharmacy is open so you can pick up the prescription now. If there is a problem, you may contact your provider through MyChart messaging and have the prescription routed to another pharmacy.  Your safety is important to us. If you have drug allergies check your prescription carefully.   For the next 24 hours you can use MyChart to ask questions about today's visit, request a non-urgent call back, or ask for a work or school excuse. You will   get an email with a survey after your eVisit asking about your experience. We would appreciate your feedback. I hope that your e-visit has been valuable and will aid in your recovery.   Mary-Margaret Kaydon Creedon, FNP   5-10  minutes spent reviewing and documenting in chart.    

## 2023-02-14 ENCOUNTER — Ambulatory Visit: Payer: Commercial Managed Care - PPO | Admitting: Family Medicine

## 2023-02-14 VITALS — BP 100/72 | HR 90 | Temp 98.2°F | Ht 61.0 in | Wt 189.0 lb

## 2023-02-14 DIAGNOSIS — M940 Chondrocostal junction syndrome [Tietze]: Secondary | ICD-10-CM | POA: Diagnosis not present

## 2023-02-14 NOTE — Progress Notes (Signed)
Acute Office Visit  Subjective:     Patient ID: Joan Mcgee, female    DOB: [birth year], 42 y.o.   MRN: [id number]  Chief Complaint  Patient presents with   Chest Pain    Patient complains of intermittent right sided rib pain x3 weeks   Cough    Productive with brown sputum x8 days, had an E-visit, diagnosed with eustachian tube dysfunction, given Rx for Augmentin, currently resolved    Chest Pain  Associated symptoms include a cough.  Cough Associated symptoms include chest pain.   Patient is in today for right rib pain for the 3 weeks, comes and goes, is tender when she pushes down on the lower costal rib margin. States she was out walking and it started hurting this morning. No problems breathing, describes it as a random sharp pain.    states she did have a cold earlier -- nasal congestion and sputum production for a URI/ ear pain. States that the congestion has gotten better. Has been taking mucinex.   Review of Systems  Respiratory:  Positive for cough.   Cardiovascular:  Positive for chest pain.  All other systems reviewed and are negative.       Objective:    BP 100/72 (BP Location: Left Arm, Patient Position: Sitting, Cuff Size: Normal)   Pulse 90   Temp 98.2 F (36.8 C) (Oral)   Ht 5\' 1"  (1.549 m)   Wt 189 lb (85.7 kg)   LMP 02/13/2023 (Exact Date)   SpO2 98%   BMI 35.71 kg/m    Physical Exam Vitals reviewed.  Constitutional:      Appearance: Normal appearance. She is well-groomed and normal weight.  HENT:     Right Ear: A middle ear effusion is present.     Left Ear: A middle ear effusion is present. Tympanic membrane is erythematous.  Eyes:     Conjunctiva/sclera: Conjunctivae normal.  Cardiovascular:     Rate and Rhythm: Normal rate and regular rhythm.     Pulses: Normal pulses.     Heart sounds: Normal heart sounds, S1 normal and S2 normal. No murmur heard. Pulmonary:     Effort: Pulmonary effort is normal.     Breath sounds: Normal breath  sounds and air entry.  Abdominal:     General: Bowel sounds are normal.  Neurological:     Mental Status: She is alert and oriented to person, place, and time. Mental status is at baseline.     Gait: Gait is intact.  Psychiatric:        Mood and Affect: Mood and affect normal.        Speech: Speech normal.        Behavior: Behavior normal.        Judgment: Judgment normal.     No results found for any visits on 02/14/23.      Assessment & Plan:   Problem List Items Addressed This Visit   None Visit Diagnoses     Costal chondritis    -  Primary      There is tenderness to palpation of the right lower costal rib margin, the lungs appear clear and there are no abdominal symptoms. Most likely costal chondritis, exacerbated by the URI and coughing. Recommended OTC ibuprofen, up to 800 mg every 8 hours as needed for the rib pain.   No orders of the defined types were placed in this encounter.   Return if symptoms worsen or fail to improve.  Britta Mccreedy  Elmer Sow, MD

## 2023-02-18 ENCOUNTER — Ambulatory Visit: Payer: Commercial Managed Care - PPO | Admitting: Family Medicine

## 2023-02-21 ENCOUNTER — Other Ambulatory Visit (HOSPITAL_BASED_OUTPATIENT_CLINIC_OR_DEPARTMENT_OTHER): Payer: Self-pay

## 2023-02-21 MED ORDER — LEVOTHYROXINE SODIUM 100 MCG PO TABS
100.0000 ug | ORAL_TABLET | Freq: Every day | ORAL | 5 refills | Status: DC
  Filled 2023-02-21 – 2023-02-24 (×3): qty 30, 30d supply, fill #0

## 2023-02-21 MED ORDER — LIOTHYRONINE SODIUM 5 MCG PO TABS
5.0000 ug | ORAL_TABLET | Freq: Every day | ORAL | 0 refills | Status: DC
  Filled 2023-02-21 – 2023-02-22 (×2): qty 90, 90d supply, fill #0

## 2023-02-22 ENCOUNTER — Other Ambulatory Visit (HOSPITAL_BASED_OUTPATIENT_CLINIC_OR_DEPARTMENT_OTHER): Payer: Self-pay

## 2023-02-23 ENCOUNTER — Other Ambulatory Visit (HOSPITAL_BASED_OUTPATIENT_CLINIC_OR_DEPARTMENT_OTHER): Payer: Self-pay

## 2023-02-24 ENCOUNTER — Other Ambulatory Visit: Payer: Self-pay

## 2023-03-10 ENCOUNTER — Other Ambulatory Visit (HOSPITAL_BASED_OUTPATIENT_CLINIC_OR_DEPARTMENT_OTHER): Payer: Self-pay

## 2023-03-19 ENCOUNTER — Encounter: Payer: Self-pay | Admitting: Family Medicine

## 2023-03-20 ENCOUNTER — Other Ambulatory Visit: Payer: Self-pay

## 2023-03-20 ENCOUNTER — Other Ambulatory Visit (HOSPITAL_BASED_OUTPATIENT_CLINIC_OR_DEPARTMENT_OTHER): Payer: Self-pay

## 2023-03-20 MED ORDER — LIOTHYRONINE SODIUM 5 MCG PO TABS
5.0000 ug | ORAL_TABLET | Freq: Every day | ORAL | 0 refills | Status: DC
  Filled 2023-03-20 – 2023-06-06 (×2): qty 90, 90d supply, fill #0

## 2023-03-20 MED ORDER — LEVOTHYROXINE SODIUM 100 MCG PO TABS
100.0000 ug | ORAL_TABLET | Freq: Every day | ORAL | 0 refills | Status: DC
  Filled 2023-03-20: qty 90, 90d supply, fill #0

## 2023-03-20 NOTE — Telephone Encounter (Signed)
Ok to refill for patient? ?

## 2023-03-26 ENCOUNTER — Other Ambulatory Visit: Payer: Self-pay | Admitting: Family Medicine

## 2023-03-26 ENCOUNTER — Other Ambulatory Visit (HOSPITAL_BASED_OUTPATIENT_CLINIC_OR_DEPARTMENT_OTHER): Payer: Self-pay

## 2023-03-26 DIAGNOSIS — E669 Obesity, unspecified: Secondary | ICD-10-CM

## 2023-04-02 ENCOUNTER — Encounter (INDEPENDENT_AMBULATORY_CARE_PROVIDER_SITE_OTHER): Payer: Self-pay

## 2023-04-18 ENCOUNTER — Ambulatory Visit: Payer: Commercial Managed Care - PPO | Admitting: Family Medicine

## 2023-04-25 ENCOUNTER — Ambulatory Visit: Payer: Commercial Managed Care - PPO | Admitting: Family Medicine

## 2023-04-28 ENCOUNTER — Encounter: Payer: Self-pay | Admitting: Family Medicine

## 2023-04-30 ENCOUNTER — Ambulatory Visit: Payer: Commercial Managed Care - PPO | Admitting: Family Medicine

## 2023-05-07 ENCOUNTER — Encounter: Payer: Self-pay | Admitting: Obstetrics & Gynecology

## 2023-05-07 ENCOUNTER — Ambulatory Visit: Payer: Commercial Managed Care - PPO | Admitting: Family Medicine

## 2023-05-07 ENCOUNTER — Ambulatory Visit: Payer: Commercial Managed Care - PPO | Admitting: Obstetrics & Gynecology

## 2023-05-07 VITALS — BP 132/86 | HR 87 | Ht 63.0 in | Wt 192.0 lb

## 2023-05-07 DIAGNOSIS — N951 Menopausal and female climacteric states: Secondary | ICD-10-CM | POA: Diagnosis not present

## 2023-05-07 DIAGNOSIS — N939 Abnormal uterine and vaginal bleeding, unspecified: Secondary | ICD-10-CM | POA: Diagnosis not present

## 2023-05-07 DIAGNOSIS — R4589 Other symptoms and signs involving emotional state: Secondary | ICD-10-CM

## 2023-05-07 NOTE — Progress Notes (Signed)
   GYN VISIT Patient name: Joan Mcgee MRN [id number]  Date of birth: 02/06/1981 Chief Complaint:   Gynecologic Exam  History of Present Illness:   Kandus Pogany is a 42 y.o. G40P3003 female being seen today for the following concerns:  Since Jan.  Menses have been starting to be irregular q 21-40 days.   Menses July 6 and July 30 then no period in August.  She feels like she is about to start, but then has not. Due to the change in her periods she "looked down there "and feels like her cervix is falling out.  She came immediately to the office because she was concerned.  She denies feeling a bulge.  Denies vaginal discharge, itching, irritation.  Denies dyspareunia .  She reports no urinary concerns.  Denies pelvic pain.  Reports no other acute complaints  Patient's last menstrual period was 04/01/2023.    Review of Systems:   Pertinent items are noted in HPI Denies fever/chills, dizziness, headaches, visual disturbances, fatigue, shortness of breath, chest pain, abdominal pain, vomiting, no problems with  bowel movements, urination, or intercourse unless otherwise stated above.  Pertinent History Reviewed:   Past Surgical History:  Procedure Laterality Date   ASD REPAIR N/A    CARDIAC SURGERY     as infant and child   veins      Past Medical History:  Diagnosis Date   Congenital heart defect    Depression    Frequent headaches    Herpes    History of blood clots 11/05/2016   Had superficial clots in legs when pregnant,had veins done with laser in past, and has appt with [place] [facility] 3/15   History of herpes simplex infection 06/27/2014   Hypothyroid 11/19/2016   Vaginal itching 06/27/2014   Varicose veins    Reviewed problem list, medications and allergies. Physical Assessment:   Vitals:   05/07/23 1435  BP: 132/86  Pulse: 87  Weight: 192 lb (87.1 kg)  Height: 5\' 3"  (1.6 m)  Body mass index is 34.01 kg/m.       Physical Examination:   General appearance: alert,  well appearing, and in no distress  Psych: mood appropriate, normal affect  Skin: warm & dry   Cardiovascular: normal heart rate noted  Respiratory: normal respiratory effort, no distress  Abdomen: soft, non-tender, no rebound, no guarding  Pelvic: VULVA: normal appearing vulva with no masses, tenderness or lesions, VAGINA: normal appearing vagina with normal color and discharge, no lesions, CERVIX: normal appearing cervix without discharge or lesions, UTERUS: uterus is normal size, shape, consistency and nontender, ADNEXA: normal adnexa in size, nontender and no masses  Extremities: no edema   Chaperone: Faith Rogue    Assessment & Plan:  1) Perimenopausal, AUB, anxiety about health -Discussed definition of perimenopause and reviewed potential symptoms including change in menses -Discussed potential options including low-dose COC if desired -Reassured patient that no acute abnormalities were noted on exam -Will plan to monitor at this time and should she note worsening or any change in her symptoms RTC   No orders of the defined types were placed in this encounter.   Return for Jan. 2025.   Myna Hidalgo, DO Attending Obstetrician & Gynecologist, [facility] for [employer], [facility]

## 2023-05-08 ENCOUNTER — Ambulatory Visit: Payer: Commercial Managed Care - PPO | Admitting: Family Medicine

## 2023-05-08 ENCOUNTER — Encounter: Payer: Self-pay | Admitting: Family Medicine

## 2023-05-08 ENCOUNTER — Other Ambulatory Visit (HOSPITAL_BASED_OUTPATIENT_CLINIC_OR_DEPARTMENT_OTHER): Payer: Self-pay

## 2023-05-08 VITALS — BP 118/70 | HR 85 | Temp 98.4°F | Ht 63.0 in | Wt 193.1 lb

## 2023-05-08 DIAGNOSIS — F419 Anxiety disorder, unspecified: Secondary | ICD-10-CM

## 2023-05-08 DIAGNOSIS — E669 Obesity, unspecified: Secondary | ICD-10-CM | POA: Diagnosis not present

## 2023-05-08 DIAGNOSIS — Z23 Encounter for immunization: Secondary | ICD-10-CM | POA: Diagnosis not present

## 2023-05-08 MED ORDER — VENLAFAXINE HCL ER 75 MG PO CP24
75.0000 mg | ORAL_CAPSULE | Freq: Every day | ORAL | 2 refills | Status: AC
Start: 2023-05-08 — End: ?
  Filled 2023-05-08: qty 30, 30d supply, fill #0
  Filled 2023-06-06: qty 30, 30d supply, fill #1

## 2023-05-08 MED ORDER — PHENTERMINE HCL 30 MG PO CAPS
30.0000 mg | ORAL_CAPSULE | ORAL | 0 refills | Status: AC
Start: 2023-06-05 — End: ?
  Filled 2023-05-08 – 2023-06-06 (×2): qty 30, 30d supply, fill #0

## 2023-05-08 MED ORDER — PHENTERMINE HCL 30 MG PO CAPS
30.0000 mg | ORAL_CAPSULE | ORAL | 0 refills | Status: DC
  Filled 2023-05-08: qty 30, 30d supply, fill #0

## 2023-05-08 MED ORDER — TOPIRAMATE 50 MG PO TABS
50.0000 mg | ORAL_TABLET | Freq: Every day | ORAL | 2 refills | Status: DC
Start: 2023-05-08 — End: 2023-10-30
  Filled 2023-05-08: qty 30, 30d supply, fill #0
  Filled 2023-06-19: qty 30, 30d supply, fill #1

## 2023-05-08 MED ORDER — PHENTERMINE HCL 30 MG PO CAPS
30.0000 mg | ORAL_CAPSULE | ORAL | 0 refills | Status: AC
Start: 2023-05-08 — End: ?
  Filled 2023-05-08: qty 30, 30d supply, fill #0

## 2023-05-08 NOTE — Progress Notes (Signed)
Established Patient Office Visit  Subjective   Patient ID: Joan Mcgee, female    DOB: 1981/08/12  Age: 42 y.o. MRN: [id number]  Chief Complaint  Patient presents with   Medical Management of Chronic Issues    Pt is here for follow up on weight loss. She reports that the capsules are somewhat effective, states that she might need a higher dose. States that when she took the tablets they interfered with her sleep, but the capsules do not. Patient's current weight is 193, starting weight was 199, net loss of 6 pounds. Pt reports no side effects to the medication.   Anxiety/depression-- pt states she used to be on medication for this but has not taken it in some time. States her previous PCP had her on wellbutrin, however she sometimes felt it was making her anxiety worse. Pt states she needs something to help however she is worried about the side effect of weight gain.    Current Outpatient Medications  Medication Instructions   cyanocobalamin (VITAMIN B12) 1,000 mcg, Oral, Daily   ibuprofen (ADVIL) 800 MG tablet Oral   levothyroxine (SYNTHROID) 100 mcg, Oral, Daily   levothyroxine (SYNTHROID) 100 mcg, Oral, Daily   liothyronine (CYTOMEL) 5 mcg, Oral, Daily   liothyronine (CYTOMEL) 5 mcg, Oral, Daily   MAGNESIUM PO Oral   [START ON 07/04/2023] phentermine 30 mg, Oral, BH-each morning   [START ON 06/05/2023] phentermine 30 mg, Oral, BH-each morning   phentermine 30 mg, Oral, BH-each morning   Probiotic Product (PROBIOTIC-10 PO) Oral   topiramate (TOPAMAX) 50 mg, Oral, Daily   valACYclovir (VALTREX) 500 mg, Oral, As needed   venlafaxine XR (EFFEXOR XR) 75 mg, Oral, Daily with breakfast    Patient Active Problem List   Diagnosis Date Noted   Obesity (BMI 30-39.9) 01/23/2023   Varicose veins of both lower extremities 01/23/2023   Anxiety 04/24/2022   Tachycardia 04/24/2022   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 08/08/2021   Class 1 obesity due to excess calories without serious  comorbidity with body mass index (BMI) of 32.0 to 32.9 in adult 06/08/2021   Anxiety and depression 06/08/2021   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  Constitutional:      Appearance: Normal appearance.  Eyes:     Conjunctiva/sclera: Conjunctivae normal.  Cardiovascular:     Heart sounds: Normal heart sounds. No murmur heard. Pulmonary:     Effort: Pulmonary effort is normal.     Breath sounds: Normal breath sounds. No wheezing.  Neurological:     Mental Status: She is alert and oriented to person, place, and time. Mental status is at baseline.  Psychiatric:        Mood and Affect: Mood normal.        Behavior: Behavior normal.      No results found for any visits on 05/08/23.    The 10-year  ASCVD risk score (Arnett DK, et al., 2019) is: 0.5%    Assessment & Plan:  Need for immunization against influenza -     Flu vaccine trivalent PF, 6mos and older(Flulaval,Afluria,Fluarix,Fluzone)  Obesity (BMI 30-39.9) Assessment & Plan: Will increase her phentermine to 30 mg capsules daily, will see every 3 months for weight checks and refills.   Orders: -     Phentermine HCl; Take 1 capsule (30 mg total) by mouth every morning.  Dispense: 30 capsule; Refill: 0 -     Phentermine HCl; Take 1 capsule (30 mg total) by mouth every morning.  Dispense: 30 capsule; Refill: 0 -     Phentermine HCl; Take 1 capsule (30 mg total) by mouth every morning.  Dispense: 30 capsule; Refill: 0 -      Topiramate; Take 1 tablet (50 mg total) by mouth daily.  Dispense: 30 tablet; Refill: 2  Anxiety Assessment & Plan: Pt did not do well on wellbutrin, does not want to start medication that would cause weight gain. We discussed trying effexor to see if this would help without causing weight gain. I will see her back in 3 months for follow up.  Orders: -     Venlafaxine HCl ER; Take 1 capsule (75 mg total) by mouth daily with breakfast.  Dispense: 30 capsule; Refill: 2     Return in about 3 months (around 08/07/2023) for weight loss.    Karie Georges, MD

## 2023-05-12 ENCOUNTER — Ambulatory Visit: Payer: Commercial Managed Care - PPO | Admitting: Family Medicine

## 2023-05-12 NOTE — Assessment & Plan Note (Signed)
Pt did not do well on wellbutrin, does not want to start medication that would cause weight gain. We discussed trying effexor to see if this would help without causing weight gain. I will see her back in 3 months for follow up.

## 2023-05-12 NOTE — Assessment & Plan Note (Signed)
Will increase her phentermine to 30 mg capsules daily, will see every 3 months for weight checks and refills.

## 2023-05-13 ENCOUNTER — Ambulatory Visit: Payer: Commercial Managed Care - PPO | Admitting: Family Medicine

## 2023-06-05 ENCOUNTER — Encounter: Payer: Commercial Managed Care - PPO | Admitting: Adult Health

## 2023-06-06 ENCOUNTER — Other Ambulatory Visit (HOSPITAL_BASED_OUTPATIENT_CLINIC_OR_DEPARTMENT_OTHER): Payer: Self-pay

## 2023-06-06 ENCOUNTER — Other Ambulatory Visit: Payer: Self-pay

## 2023-06-10 ENCOUNTER — Ambulatory Visit: Payer: Commercial Managed Care - PPO | Admitting: Family Medicine

## 2023-06-19 ENCOUNTER — Other Ambulatory Visit: Payer: Self-pay | Admitting: Family Medicine

## 2023-06-19 ENCOUNTER — Other Ambulatory Visit: Payer: Self-pay

## 2023-06-23 ENCOUNTER — Encounter: Payer: Self-pay | Admitting: Family Medicine

## 2023-06-24 ENCOUNTER — Other Ambulatory Visit (HOSPITAL_BASED_OUTPATIENT_CLINIC_OR_DEPARTMENT_OTHER): Payer: Self-pay

## 2023-06-24 MED ORDER — LEVOTHYROXINE SODIUM 100 MCG PO TABS
100.0000 ug | ORAL_TABLET | Freq: Every day | ORAL | 0 refills | Status: DC
  Filled 2023-06-24: qty 90, 90d supply, fill #0

## 2023-06-25 ENCOUNTER — Other Ambulatory Visit (HOSPITAL_BASED_OUTPATIENT_CLINIC_OR_DEPARTMENT_OTHER): Payer: Self-pay

## 2023-07-15 ENCOUNTER — Other Ambulatory Visit (HOSPITAL_BASED_OUTPATIENT_CLINIC_OR_DEPARTMENT_OTHER): Payer: Self-pay

## 2023-07-15 MED ORDER — ONDANSETRON HCL 4 MG PO TABS
4.0000 mg | ORAL_TABLET | Freq: Three times a day (TID) | ORAL | 0 refills | Status: DC | PRN
  Filled 2023-07-15: qty 21, 7d supply, fill #0

## 2023-07-22 ENCOUNTER — Other Ambulatory Visit (HOSPITAL_BASED_OUTPATIENT_CLINIC_OR_DEPARTMENT_OTHER): Payer: Self-pay

## 2023-07-22 DIAGNOSIS — R7301 Impaired fasting glucose: Secondary | ICD-10-CM | POA: Diagnosis not present

## 2023-07-22 MED ORDER — WEGOVY 0.25 MG/0.5ML ~~LOC~~ SOAJ
0.2500 mg | SUBCUTANEOUS | 5 refills | Status: AC
Start: 2023-07-22 — End: ?
  Filled 2023-07-22: qty 2, 28d supply, fill #0

## 2023-07-25 ENCOUNTER — Other Ambulatory Visit (HOSPITAL_BASED_OUTPATIENT_CLINIC_OR_DEPARTMENT_OTHER): Payer: Self-pay

## 2023-07-26 ENCOUNTER — Other Ambulatory Visit (HOSPITAL_BASED_OUTPATIENT_CLINIC_OR_DEPARTMENT_OTHER): Payer: Self-pay

## 2023-08-08 ENCOUNTER — Other Ambulatory Visit (HOSPITAL_BASED_OUTPATIENT_CLINIC_OR_DEPARTMENT_OTHER): Payer: Self-pay

## 2023-08-24 ENCOUNTER — Other Ambulatory Visit (HOSPITAL_BASED_OUTPATIENT_CLINIC_OR_DEPARTMENT_OTHER): Payer: Self-pay

## 2023-08-25 ENCOUNTER — Other Ambulatory Visit (HOSPITAL_BASED_OUTPATIENT_CLINIC_OR_DEPARTMENT_OTHER): Payer: Self-pay

## 2023-09-02 ENCOUNTER — Other Ambulatory Visit: Payer: Self-pay | Admitting: Family Medicine

## 2023-09-05 ENCOUNTER — Other Ambulatory Visit (HOSPITAL_BASED_OUTPATIENT_CLINIC_OR_DEPARTMENT_OTHER): Payer: Self-pay

## 2023-09-05 MED ORDER — LEVOTHYROXINE SODIUM 100 MCG PO TABS
100.0000 ug | ORAL_TABLET | Freq: Every day | ORAL | 0 refills | Status: DC
  Filled 2023-09-05: qty 90, 90d supply, fill #0

## 2023-09-05 MED ORDER — LIOTHYRONINE SODIUM 5 MCG PO TABS
5.0000 ug | ORAL_TABLET | Freq: Every day | ORAL | 0 refills | Status: DC
  Filled 2023-09-05: qty 90, 90d supply, fill #0

## 2023-09-05 NOTE — Telephone Encounter (Signed)
 Pt is overdue for her labs including her thyroid check. I will fill this but she needs to schedule an appointment in the next couple of months for her annual physical. Please have her schedule. Thanks!

## 2023-09-30 ENCOUNTER — Encounter: Payer: Commercial Managed Care - PPO | Admitting: Family Medicine

## 2023-10-09 ENCOUNTER — Encounter: Payer: Self-pay | Admitting: Family Medicine

## 2023-10-20 ENCOUNTER — Encounter: Payer: Self-pay | Admitting: Family Medicine

## 2023-10-20 DIAGNOSIS — Z13220 Encounter for screening for lipoid disorders: Secondary | ICD-10-CM

## 2023-10-20 DIAGNOSIS — E039 Hypothyroidism, unspecified: Secondary | ICD-10-CM

## 2023-10-20 DIAGNOSIS — F32A Depression, unspecified: Secondary | ICD-10-CM

## 2023-10-21 ENCOUNTER — Telehealth: Payer: Self-pay

## 2023-10-21 NOTE — Telephone Encounter (Signed)
Ok I placed the orders, they will need to be printed for the patientso she can take them to the [facility]

## 2023-10-21 NOTE — Telephone Encounter (Signed)
Copied from CRM [id number]. Topic: General - Other >> Oct 21, 2023  1:22 PM Jon Gills C wrote: Reason for CRM: Patient called in stating if she could get those forms for the lab order sent to her through mychart or the mail? Patient stated [place] and didn't want to go there to pick up the form .

## 2023-10-23 NOTE — Telephone Encounter (Signed)
 See My chart message

## 2023-10-24 ENCOUNTER — Encounter: Payer: Commercial Managed Care - PPO | Admitting: Family Medicine

## 2023-10-28 ENCOUNTER — Other Ambulatory Visit: Payer: Self-pay | Admitting: Family Medicine

## 2023-10-28 DIAGNOSIS — Z13220 Encounter for screening for lipoid disorders: Secondary | ICD-10-CM | POA: Diagnosis not present

## 2023-10-28 DIAGNOSIS — F32A Depression, unspecified: Secondary | ICD-10-CM | POA: Diagnosis not present

## 2023-10-28 DIAGNOSIS — E039 Hypothyroidism, unspecified: Secondary | ICD-10-CM | POA: Diagnosis not present

## 2023-10-28 DIAGNOSIS — F419 Anxiety disorder, unspecified: Secondary | ICD-10-CM | POA: Diagnosis not present

## 2023-10-29 LAB — CBC WITH DIFFERENTIAL/PLATELET
Basophils Absolute: 0 10*3/uL (ref 0.0–0.2)
Basos: 0 %
EOS (ABSOLUTE): 0 10*3/uL (ref 0.0–0.4)
Eos: 0 %
Hematocrit: 44.3 % (ref 34.0–46.6)
Hemoglobin: 14.7 g/dL (ref 11.1–15.9)
Immature Grans (Abs): 0 10*3/uL (ref 0.0–0.1)
Immature Granulocytes: 0 %
Lymphocytes Absolute: 1.5 10*3/uL (ref 0.7–3.1)
Lymphs: 34 %
MCH: 30.7 pg (ref 26.6–33.0)
MCHC: 33.2 g/dL (ref 31.5–35.7)
MCV: 93 fL (ref 79–97)
Monocytes Absolute: 0.3 10*3/uL (ref 0.1–0.9)
Monocytes: 7 %
Neutrophils Absolute: 2.6 10*3/uL (ref 1.4–7.0)
Neutrophils: 59 %
Platelets: 294 10*3/uL (ref 150–450)
RBC: 4.79 x10E6/uL (ref 3.77–5.28)
RDW: 13.2 % (ref 11.7–15.4)
WBC: 4.5 10*3/uL (ref 3.4–10.8)

## 2023-10-29 LAB — COMPREHENSIVE METABOLIC PANEL
ALT: 22 [IU]/L (ref 0–32)
AST: 21 [IU]/L (ref 0–40)
Albumin: 4.5 g/dL (ref 3.9–4.9)
Alkaline Phosphatase: 71 [IU]/L (ref 44–121)
BUN/Creatinine Ratio: 15 (ref 9–23)
BUN: 11 mg/dL (ref 6–24)
Bilirubin Total: 0.3 mg/dL (ref 0.0–1.2)
CO2: 23 mmol/L (ref 20–29)
Calcium: 9 mg/dL (ref 8.7–10.2)
Chloride: 102 mmol/L (ref 96–106)
Creatinine, Ser: 0.73 mg/dL (ref 0.57–1.00)
Globulin, Total: 2 g/dL (ref 1.5–4.5)
Glucose: 90 mg/dL (ref 70–99)
Potassium: 5 mmol/L (ref 3.5–5.2)
Sodium: 139 mmol/L (ref 134–144)
Total Protein: 6.5 g/dL (ref 6.0–8.5)
eGFR: 105 mL/min/{1.73_m2} (ref >59)

## 2023-10-29 LAB — LIPID PANEL
Chol/HDL Ratio: 4.2 {ratio} (ref 0.0–4.4)
Cholesterol, Total: 161 mg/dL (ref 100–199)
HDL: 38 mg/dL — ABNORMAL LOW (ref >39)
LDL Chol Calc (NIH): 109 mg/dL — ABNORMAL HIGH (ref 0–99)
Triglycerides: 73 mg/dL (ref 0–149)
VLDL Cholesterol Cal: 14 mg/dL (ref 5–40)

## 2023-10-29 LAB — TSH: TSH: 0.484 u[IU]/mL (ref 0.450–4.500)

## 2023-10-30 ENCOUNTER — Other Ambulatory Visit (HOSPITAL_BASED_OUTPATIENT_CLINIC_OR_DEPARTMENT_OTHER): Payer: Self-pay

## 2023-10-30 ENCOUNTER — Ambulatory Visit (INDEPENDENT_AMBULATORY_CARE_PROVIDER_SITE_OTHER): Payer: Commercial Managed Care - PPO | Admitting: Family Medicine

## 2023-10-30 ENCOUNTER — Encounter: Payer: Self-pay | Admitting: Family Medicine

## 2023-10-30 ENCOUNTER — Encounter (HOSPITAL_COMMUNITY): Payer: Self-pay | Admitting: Family Medicine

## 2023-10-30 VITALS — BP 122/80 | HR 84 | Temp 98.1°F | Ht 62.0 in | Wt 182.4 lb

## 2023-10-30 DIAGNOSIS — E039 Hypothyroidism, unspecified: Secondary | ICD-10-CM

## 2023-10-30 DIAGNOSIS — Z0000 Encounter for general adult medical examination without abnormal findings: Secondary | ICD-10-CM

## 2023-10-30 DIAGNOSIS — N6321 Unspecified lump in the left breast, upper outer quadrant: Secondary | ICD-10-CM

## 2023-10-30 MED ORDER — LIOTHYRONINE SODIUM 5 MCG PO TABS
5.0000 ug | ORAL_TABLET | Freq: Every day | ORAL | 1 refills | Status: DC
  Filled 2023-10-30 – 2023-12-04 (×2): qty 90, 90d supply, fill #0
  Filled 2024-03-08 – 2024-03-20 (×2): qty 90, 90d supply, fill #1

## 2023-10-30 MED ORDER — LEVOTHYROXINE SODIUM 100 MCG PO TABS
100.0000 ug | ORAL_TABLET | Freq: Every day | ORAL | 1 refills | Status: DC
  Filled 2023-10-30 – 2023-12-04 (×2): qty 90, 90d supply, fill #0
  Filled 2024-03-08 – 2024-03-20 (×2): qty 90, 90d supply, fill #1

## 2023-10-30 NOTE — Patient Instructions (Signed)

## 2023-10-30 NOTE — Progress Notes (Signed)
 Complete physical exam  Patient: Joan Mcgee   DOB: 22-Apr-1981   43 y.o. Female  MRN: [id number]  Subjective:    Chief Complaint  Patient presents with   Annual Exam    Joan Mcgee is a 43 y.o. female who presents today for a complete physical exam. She reports consuming a general diet. Home exercise routine includes walking 2 hrs per week and weight lifting twice a week. She generally feels well. She reports sleeping is ok, states she often will have multiple nighttime awakenings, takes unisom and melatonin. She does not have additional problems to discuss today.   Pt is reporting a new breast lump that she felt in the left breast about 1 month ago, states that it wasn't there before, in the outer quadrant, no other associated side effects, no skin or nipple changes. It is slightly tender to palpation.   Most recent fall risk assessment:    07/23/2022   10:17 AM  Fall Risk   Falls in the past year? 0  Number falls in past yr: 0  Injury with Fall? 0  Risk for fall due to : No Fall Risks  Follow up Falls evaluation completed     Most recent depression screenings:    10/30/2023    2:20 PM 01/23/2023   11:27 AM  PHQ 2/9 Scores  PHQ - 2 Score 0 0  PHQ- 9 Score  1    Vision:Not within last year  and no vision issues and Dental: No current dental problems and Receives regular dental care  Patient Active Problem List   Diagnosis Date Noted   Obesity (BMI 30-39.9) 01/23/2023   Varicose veins of both lower extremities 01/23/2023   Anxiety 04/24/2022   Tachycardia 04/24/2022   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 08/08/2021   Class 1 obesity due to excess calories without serious comorbidity with body mass index (BMI) of 32.0 to 32.9 in adult 06/08/2021   Anxiety and depression 06/08/2021   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   levothyroxine (SYNTHROID) 100 MCG tablet Take 100 mcg by mouth daily.   levothyroxine (SYNTHROID) 100 MCG tablet Take 1 tablet (100 mcg total) by mouth daily.   liothyronine (CYTOMEL) 5 MCG tablet Take 1 tablet (5 mcg total) by mouth daily.   liothyronine (CYTOMEL) 5 MCG tablet Take 1 tablet (5 mcg total) by mouth daily.   MAGNESIUM PO Take by mouth.   phentermine 30 MG capsule Take 1 capsule (30 mg total) by mouth every morning.   phentermine 30 MG capsule Take 1 capsule (30 mg total) by mouth every morning.   Probiotic Product (PROBIOTIC-10 PO) Take by mouth.   Semaglutide-Weight Management (WEGOVY) 0.25 MG/0.5ML SOAJ Inject 0.25 mg into the skin once a week.   valACYclovir (VALTREX) 500 MG tablet Take 500 mg by mouth as needed.   venlafaxine XR (EFFEXOR XR) 75 MG 24 hr capsule Take 1 capsule (75 mg total) by mouth daily with breakfast.   vitamin B-12 (CYANOCOBALAMIN) 1000 MCG tablet Take 1,000 mcg by mouth daily.   [DISCONTINUED] ondansetron (ZOFRAN) 4 MG  tablet Take 1 tablet (4 mg total) by mouth every 8 (eight) hours as needed.   [DISCONTINUED] phentermine 30 MG capsule Take 1 capsule (30 mg total) by mouth every morning.   [DISCONTINUED] topiramate (TOPAMAX) 50 MG tablet Take 1 tablet (50 mg total) by mouth daily.   No facility-administered medications prior to visit.    Review of Systems  HENT:  Negative for hearing loss.   Eyes:  Negative for blurred vision.  Respiratory:  Negative for shortness of breath.   Cardiovascular:  Negative for chest pain.  Gastrointestinal: Negative.   Genitourinary: Negative.   Musculoskeletal:  Negative for back pain.  Neurological:  Negative for headaches.  Psychiatric/Behavioral:   Negative for depression.   All other systems reviewed and are negative.      Objective:     BP 122/80   Pulse 84   Temp 98.1 F (36.7 C) (Oral)   Ht 5\' 2"  (1.575 m)   Wt 182 lb 6.4 oz (82.7 kg)   LMP 10/06/2023 (Exact Date)   SpO2 99%   BMI 33.36 kg/m    Physical Exam Vitals reviewed.  Constitutional:      Appearance: Normal appearance. She is well-groomed and normal weight.  HENT:     Right Ear: Tympanic membrane and ear canal normal.     Left Ear: Tympanic membrane and ear canal normal.     Mouth/Throat:     Mouth: Mucous membranes are moist.     Pharynx: No posterior oropharyngeal erythema.  Eyes:     Conjunctiva/sclera: Conjunctivae normal.  Neck:     Thyroid: No thyromegaly.  Cardiovascular:     Rate and Rhythm: Normal rate and regular rhythm.     Pulses: Normal pulses.     Heart sounds: S1 normal and S2 normal.  Pulmonary:     Effort: Pulmonary effort is normal.     Breath sounds: Normal breath sounds and air entry.  Abdominal:     General: Abdomen is flat. Bowel sounds are normal.     Palpations: Abdomen is soft.  Musculoskeletal:     Right lower leg: No edema.     Left lower leg: No edema.  Lymphadenopathy:     Cervical: No cervical adenopathy.  Neurological:     Mental Status: She is alert and oriented to person, place, and time. Mental status is at baseline.     Gait: Gait is intact.  Psychiatric:        Mood and Affect: Mood and affect normal.        Speech: Speech normal.        Behavior: Behavior normal.        Judgment: Judgment normal.      No results found for any visits on 10/30/23.     Assessment & Plan:    Routine Health Maintenance and Physical Exam  Immunization History  Administered Date(s) Administered   Influenza, Quadrivalent, Recombinant, Inj, Pf 06/26/2022   Influenza, Seasonal, Injecte, Preservative Fre 05/08/2023   Influenza,inj,Quad PF,6+ Mos 06/08/2021   Influenza,inj,quad, With Preservative 06/22/2020   Moderna  Sars-Covid-2 Vaccination 03/16/2020, 06/22/2020   Rabies, IM 03/17/2021, 03/20/2021, 03/25/2021, 03/31/2021   Tdap 03/17/2021    Health Maintenance  Topic Date Due   COVID-19 Vaccine (5 - 2024-25 season) 05/04/2023   Hepatitis C Screening  02/14/2024 (Originally 02/08/1998)   HIV Screening  10/29/2024 (Originally 02/09/1995)   Cervical Cancer Screening (HPV/Pap Cotest)  09/24/2025   DTaP/Tdap/Td (2 - Td or Tdap) 03/18/2031   INFLUENZA  VACCINE  Completed   HPV VACCINES  Aged Out    Discussed health benefits of physical activity, and encouraged her to engage in regular exercise appropriate for her age and condition.  Routine general medical examination at a health care facility  Mass of upper outer quadrant of left breast -     MM 3D DIAGNOSTIC MAMMOGRAM BILATERAL BREAST; Future  Normal physical exam findings today, reviewed HM and she is UTD. Counseled patient on healthy sleep habits and also possible sx of premenopause. Handouts given on healthy eating and exercise. I reviewed her labs that she had done 2 days ago, refills sent for the thyroid medication.   Return in about 1 year (around 10/29/2024) for annual physical exam.     Karie Georges, MD

## 2023-10-31 ENCOUNTER — Encounter: Payer: Commercial Managed Care - PPO | Admitting: Family Medicine

## 2023-11-03 ENCOUNTER — Encounter: Payer: Self-pay | Admitting: Family Medicine

## 2023-11-04 ENCOUNTER — Other Ambulatory Visit: Payer: Self-pay | Admitting: Family Medicine

## 2023-11-04 DIAGNOSIS — N6321 Unspecified lump in the left breast, upper outer quadrant: Secondary | ICD-10-CM

## 2023-11-07 ENCOUNTER — Encounter: Payer: Commercial Managed Care - PPO | Admitting: Family Medicine

## 2023-12-04 ENCOUNTER — Other Ambulatory Visit (HOSPITAL_BASED_OUTPATIENT_CLINIC_OR_DEPARTMENT_OTHER): Payer: Self-pay

## 2024-03-18 ENCOUNTER — Other Ambulatory Visit (HOSPITAL_BASED_OUTPATIENT_CLINIC_OR_DEPARTMENT_OTHER): Payer: Self-pay

## 2024-03-20 ENCOUNTER — Other Ambulatory Visit (HOSPITAL_BASED_OUTPATIENT_CLINIC_OR_DEPARTMENT_OTHER): Payer: Self-pay

## 2024-05-21 ENCOUNTER — Ambulatory Visit: Admitting: Adult Health

## 2024-05-26 ENCOUNTER — Other Ambulatory Visit (HOSPITAL_COMMUNITY): Payer: Self-pay | Admitting: Family Medicine

## 2024-05-26 DIAGNOSIS — Z1231 Encounter for screening mammogram for malignant neoplasm of breast: Secondary | ICD-10-CM

## 2024-05-31 ENCOUNTER — Ambulatory Visit (HOSPITAL_COMMUNITY)
Admission: RE | Admit: 2024-05-31 | Discharge: 2024-05-31 | Disposition: A | Discharge status: Home / Self Care | Source: Ambulatory Visit | Attending: Family Medicine | Admitting: Family Medicine

## 2024-05-31 DIAGNOSIS — Z1231 Encounter for screening mammogram for malignant neoplasm of breast: Secondary | ICD-10-CM | POA: Insufficient documentation

## 2024-06-04 ENCOUNTER — Ambulatory Visit: Payer: Self-pay | Admitting: Family Medicine

## 2024-06-09 ENCOUNTER — Other Ambulatory Visit (HOSPITAL_BASED_OUTPATIENT_CLINIC_OR_DEPARTMENT_OTHER): Payer: Self-pay

## 2024-06-11 ENCOUNTER — Other Ambulatory Visit: Payer: Self-pay

## 2024-06-11 ENCOUNTER — Other Ambulatory Visit: Payer: Self-pay | Admitting: Family Medicine

## 2024-06-11 ENCOUNTER — Other Ambulatory Visit (HOSPITAL_BASED_OUTPATIENT_CLINIC_OR_DEPARTMENT_OTHER): Payer: Self-pay

## 2024-06-11 DIAGNOSIS — E039 Hypothyroidism, unspecified: Secondary | ICD-10-CM

## 2024-06-11 MED ORDER — LEVOTHYROXINE SODIUM 100 MCG PO TABS
100.0000 ug | ORAL_TABLET | Freq: Every day | ORAL | 1 refills | Status: AC
  Filled 2024-06-11: qty 90, 90d supply, fill #0
  Filled 2024-09-11: qty 90, 90d supply, fill #1

## 2024-06-11 MED ORDER — LIOTHYRONINE SODIUM 5 MCG PO TABS
5.0000 ug | ORAL_TABLET | Freq: Every day | ORAL | 1 refills | Status: AC
  Filled 2024-06-11: qty 90, 90d supply, fill #0
  Filled 2024-09-11: qty 90, 90d supply, fill #1

## 2024-07-26 ENCOUNTER — Ambulatory Visit: Admitting: Adult Health

## 2024-07-27 ENCOUNTER — Ambulatory Visit: Admitting: Adult Health

## 2024-11-05 ENCOUNTER — Encounter: Admitting: Family Medicine

## 2024-12-10 ENCOUNTER — Encounter: Admitting: Family Medicine
